# Patient Record
Sex: Male | Born: 1980 | Race: Black or African American | Hispanic: No | Marital: Single | Smoking: Never smoker
Health system: Southern US, Community
[De-identification: ages and names within clinical notes are randomized; demographics above are authoritative.]

## PROBLEM LIST (undated history)

## (undated) DIAGNOSIS — K603 Anal fistula, unspecified: Secondary | ICD-10-CM

## (undated) DIAGNOSIS — L309 Dermatitis, unspecified: Secondary | ICD-10-CM

## (undated) DIAGNOSIS — I10 Essential (primary) hypertension: Secondary | ICD-10-CM

## (undated) DIAGNOSIS — G809 Cerebral palsy, unspecified: Secondary | ICD-10-CM

## (undated) DIAGNOSIS — R2689 Other abnormalities of gait and mobility: Secondary | ICD-10-CM

## (undated) DIAGNOSIS — S62609A Fracture of unspecified phalanx of unspecified finger, initial encounter for closed fracture: Secondary | ICD-10-CM

## (undated) DIAGNOSIS — E785 Hyperlipidemia, unspecified: Secondary | ICD-10-CM

## (undated) HISTORY — PX: FOOT CAPSULE RELEASE W/ PERCUTANEOUS HEEL CORD LENGTHENING, TIBIAL TENDON TRANSFER: SHX1658

## (undated) HISTORY — DX: Anal fistula, unspecified: K60.30

## (undated) HISTORY — DX: Anal fistula: K60.3

---
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
## 2002-06-19 ENCOUNTER — Emergency Department (HOSPITAL_COMMUNITY): Admission: EM | Admit: 2002-06-19 | Discharge: 2002-06-19 | Payer: Self-pay | Admitting: Emergency Medicine

## 2002-07-01 ENCOUNTER — Emergency Department (HOSPITAL_COMMUNITY): Admission: EM | Admit: 2002-07-01 | Discharge: 2002-07-02 | Payer: Self-pay | Admitting: Emergency Medicine

## 2002-07-01 ENCOUNTER — Encounter: Payer: Self-pay | Admitting: Emergency Medicine

## 2003-08-26 ENCOUNTER — Emergency Department (HOSPITAL_COMMUNITY): Admission: EM | Admit: 2003-08-26 | Discharge: 2003-08-26 | Payer: Self-pay | Admitting: *Deleted

## 2003-09-02 ENCOUNTER — Emergency Department (HOSPITAL_COMMUNITY): Admission: EM | Admit: 2003-09-02 | Discharge: 2003-09-02 | Payer: Self-pay | Admitting: Emergency Medicine

## 2003-09-08 ENCOUNTER — Emergency Department (HOSPITAL_COMMUNITY): Admission: EM | Admit: 2003-09-08 | Discharge: 2003-09-08 | Payer: Self-pay | Admitting: Emergency Medicine

## 2004-10-11 ENCOUNTER — Inpatient Hospital Stay (HOSPITAL_COMMUNITY): Admission: EM | Admit: 2004-10-11 | Discharge: 2004-10-13 | Payer: Self-pay | Admitting: Emergency Medicine

## 2004-10-14 ENCOUNTER — Ambulatory Visit (HOSPITAL_COMMUNITY): Admission: RE | Admit: 2004-10-14 | Discharge: 2004-10-14 | Payer: Self-pay | Admitting: Internal Medicine

## 2004-10-25 ENCOUNTER — Ambulatory Visit (HOSPITAL_COMMUNITY): Admission: RE | Admit: 2004-10-25 | Discharge: 2004-10-26 | Payer: Self-pay | Admitting: Family Medicine

## 2004-10-27 ENCOUNTER — Ambulatory Visit: Payer: Self-pay | Admitting: Cardiology

## 2005-05-12 ENCOUNTER — Emergency Department (HOSPITAL_COMMUNITY): Admission: EM | Admit: 2005-05-12 | Discharge: 2005-05-13 | Payer: Self-pay | Admitting: *Deleted

## 2005-05-25 ENCOUNTER — Emergency Department (HOSPITAL_COMMUNITY): Admission: EM | Admit: 2005-05-25 | Discharge: 2005-05-25 | Payer: Self-pay | Admitting: Emergency Medicine

## 2006-01-28 ENCOUNTER — Emergency Department (HOSPITAL_COMMUNITY): Admission: EM | Admit: 2006-01-28 | Discharge: 2006-01-28 | Payer: Self-pay | Admitting: Emergency Medicine

## 2006-02-03 ENCOUNTER — Emergency Department (HOSPITAL_COMMUNITY): Admission: EM | Admit: 2006-02-03 | Discharge: 2006-02-03 | Payer: Self-pay | Admitting: Emergency Medicine

## 2006-06-21 ENCOUNTER — Emergency Department (HOSPITAL_COMMUNITY): Admission: EM | Admit: 2006-06-21 | Discharge: 2006-06-21 | Payer: Self-pay | Admitting: Emergency Medicine

## 2006-09-10 ENCOUNTER — Emergency Department (HOSPITAL_COMMUNITY): Admission: EM | Admit: 2006-09-10 | Discharge: 2006-09-10 | Payer: Self-pay | Admitting: Emergency Medicine

## 2006-09-14 ENCOUNTER — Emergency Department (HOSPITAL_COMMUNITY): Admission: EM | Admit: 2006-09-14 | Discharge: 2006-09-14 | Payer: Self-pay | Admitting: Emergency Medicine

## 2007-07-17 ENCOUNTER — Encounter: Payer: Self-pay | Admitting: Orthopedic Surgery

## 2007-07-17 ENCOUNTER — Emergency Department (HOSPITAL_COMMUNITY): Admission: EM | Admit: 2007-07-17 | Discharge: 2007-07-17 | Payer: Self-pay | Admitting: Emergency Medicine

## 2007-08-01 ENCOUNTER — Ambulatory Visit: Payer: Self-pay | Admitting: Orthopedic Surgery

## 2007-08-01 DIAGNOSIS — S62609A Fracture of unspecified phalanx of unspecified finger, initial encounter for closed fracture: Secondary | ICD-10-CM

## 2007-08-01 HISTORY — DX: Fracture of unspecified phalanx of unspecified finger, initial encounter for closed fracture: S62.609A

## 2008-08-28 ENCOUNTER — Emergency Department (HOSPITAL_COMMUNITY): Admission: EM | Admit: 2008-08-28 | Discharge: 2008-08-28 | Payer: Self-pay | Admitting: Emergency Medicine

## 2011-02-11 NOTE — Procedures (Signed)
NAMETOMAS, SCHAMP            ACCOUNT NO.:  [id number]   MEDICAL RECORD NO.:  [id number]           PATIENT TYPE:   LOCATION:                                FACILITY:  [facility]   PHYSICIAN:  Kofi A. Gerilyn Pilgrim, M.D.      DATE OF BIRTH:   DATE OF PROCEDURE:  DATE OF DISCHARGE:                                EEG INTERPRETATION   Patient is a 30 year old man who has seizures.   ANALYSIS:  A 16-channel recording is conducted for approximately 24 minutes.  There is a well-formed posterior rhythm of 11 Hz which attenuates with eye  opening.  The quality of the recording is excellent.  Stage II sleep is  noted.  K complexes and sleep spindles are observed indicating stage II  sleep.  Photic stimulation does not elicit any abnormal responses in the  background neither do hyperventilation.  There is no focal or lateralized  slowing noted.  No epileptiform discharges are reported.   IMPRESSION:  This is a normal recording of sleep and awake states.  A single  recording does not rule out epilepsy.  If clinically indicated a sleep-  deprived recording could be useful.      KAD/MEDQ  D:  11/04/2004  T:  11/04/2004  Job:  [id number]

## 2011-02-11 NOTE — Discharge Summary (Signed)
NAMESHAHID, FLORI            ACCOUNT NO.:  [id number]   MEDICAL RECORD NO.:  [id number]          PATIENT TYPE:  INP   LOCATION:  A202                          FACILITY:  [facility]   PHYSICIAN:  Calvert Cantor, M.D.     DATE OF BIRTH:  24-Jun-1981   DATE OF ADMISSION:  10/11/2004  DATE OF DISCHARGE:  LH                                 DISCHARGE SUMMARY   PRIMARY CARE PHYSICIANS:  Dr. Katrinka Blazing and Dr. Loleta Chance.   DISCHARGE DIAGNOSES:  1.  Syncope, etiology unknown at this point.  2. Cerebral palsy with some      mental retardation.  3. Obesity.  4. Microcytic anemia.   DISCHARGE INSTRUCTIONS:  Discharge instructions have been discussed with the  patient and his brother.  He is to follow up with Dr. Domingo Sep in one week.  He is to have an EEG tomorrow morning as an outpatient.  He is to improve  his diet and exercise to help lose weight.  His brother states that he is  seeing a dietician who has placed him on a modified diet.  He is to follow  up with Dr. Loleta Chance in one week to find out the results of the EEG and further  workup of his syncope.   HOSPITAL COURSE:  This is a 30 year old obese African-American male who was  admitted for syncopal episode.  He began to have a headache, some dizziness,  and some trouble with his hearing while he was at work.  This worsened as he  was walking home.  When he arrived home, he fell on the floor face down.  His mother was there and noticed that he was unconscious for a couple of  minutes.  She did not notice any seizure-like activity.  No loss of control  of bowel or bladder or any tongue biting.  She stated that when he came to,  he was slightly confused.  He was admitted and a CAT scan was done in the  ER.  The CAT scan showed no acute intracranial abnormalities.  He was placed  on telemetry to watch for any arrhythmias.  His EKG on admission showed  inverted T wave in lead 3 with some flattening of the T wave in AVF.  The  rest of the EKG was normal.   Cardiac enzymes were done, three sets, which  were also normal.  On admission, a urine drug screen was done which did not  show any drug use.  UA was done which was also normal.  Blood work on  admission showed hemoglobin of 14.6, hematocrit of 42.8 with an MCV of 76.5,  and microcytes on the smear.  WBCs were normal at 5.7.  Platelets were 174.  Chemistry was essentially normal.  Sodium was 135, potassium 3.7, chloride  101, bicarbonate 30, glucose 86, BUN 11, creatinine 1.0, calcium 9.1.  An  iron profile was done for the microcytes that were seen in the smear which  showed an iron level of 144 which is actually slightly above normal.  Iron  binding was 280 which is normal.  Percent saturation was 51,  also normal.  Ferritin was 82.   I have ordered a hemoglobin electrophoresis to rule out thalassemia and  sickle cell anemia.  This should be followed up as an outpatient as the  results have not returned.  While on telemetry, the patient had normal sinus  rhythm throughout.  A repeat EKG was done.  This one showed also slight  inversions of T waves in lead 3 with upright P waves in AVF.  The rest of it  was normal showing no Q waves, no ST segment changes.  His QRS was 91.  PR  was 152.  Corrected QT was 406.  A 2D echo was done.  The reading showed  that he had a moderately enlarged right ventricle.  I have discussed this  with Dr. Domingo Sep, and I recommend that he follow up with her as an  outpatient.  The cause for this RV enlargement is not clear. He does not  smoke and therefore is unlikely to have COPD.  I doubt that he has a PE  because he was not tachypneic or tachycardic.  His pulse ox was 100% on room  air on admission.  He has an EEG which is scheduled for tomorrow morning.  I  have explained to his brother that he needs to be taken  for this test and to follow up with Dr. Loleta Chance with the results.  Until now,  the cause of his syncope is unclear.  However, currently he is stable  for  discharge.  He has been walking.  His telemetry strips remain NSR.  He has  not felt any dizziness or had any headaches during his stay.  I suggest  further followup as an outpatient.     Saim   SR/MEDQ  D:  10/13/2004  T:  10/13/2004  Job:  [id number]   cc:   Annia Friendly. Loleta Chance, MD  [address]  [place]  [place] [address]  Fax: [phone]   Dani Gobble, MD  Fax: [phone]

## 2011-02-11 NOTE — H&P (Signed)
NAMEERMINE, SPOFFORD            ACCOUNT NO.:  [id number]   MEDICAL RECORD NO.:  [id number]          PATIENT TYPE:  INP   LOCATION:  ED99                          FACILITY:  [facility]   PHYSICIAN:  Margaretmary Dys, M.D.DATE OF BIRTH:  03/23/1981   DATE OF ADMISSION:  10/11/2004  DATE OF DISCHARGE:  LH                                HISTORY & PHYSICAL   ADMISSION DIAGNOSES:  1.  Syncope.  2.  Cerebral palsy.  3.  Rule out seizure.  4.  Mental retardation.   CHIEF COMPLAINT:  Passed out this afternoon.   HISTORY OF PRESENT ILLNESS:  Mr. Avian, Konigsberg is a 30 year old African-  American male who was brought in by the emergency medical service after he  passed out in his home today. Mother was at home at the time it happened.  She heard a loud thud. When she went to look for him, she found that he was  face down on the floor. Mother said he was not responsive for about 10 to 15  minutes. The patient said prior to him passing out, he felt very lightheaded  but does not remember much. After that, he had some chest pain with weakness  in his left arm. He has never had an episode like this, never had seizure.  The patient did not have any fecal or urinary incontinence, did not have any  seizure activity. Mother did not see if he was jerky. The patient said he  had just had some juice and snack before he passed out.  He denies any headaches preceding this. He has not been working out of the  ordinary. 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 The patient also denies any  cold or flu like symptoms. The patient, however, does have cerebral palsy  and some mental retardation. He also has a residual weakness on his left  side from the cerebral palsy.  He has no fever, chills or rigors. He denies any nausea, vomiting, abdominal  pain, diarrhea, constipation. He has no skin rash. He says he has been doing  fine  until today. He takes no medications on a chronic basis.   REVIEW OF SYSTEMS:  10-point Review of Systems was otherwise negative.   PAST MEDICAL HISTORY:  1.  Mental retardation.  2.  Cerebral palsy.   CURRENT MEDICATIONS:  Does not take any medications.   ALLERGIES:  Does not have any known drug allergies.   SOCIAL HISTORY:  The patient is a Copy, working at [employer]. He lives  with his mother. No history of smoking or alcohol use. Denies any illicit  drug use or marijuana use.   FAMILY HISTORY:  Noncontributory.   PHYSICAL EXAMINATION:  GENERAL:  Conscious, alert, comfortable, not in acute  distress. Oriented to time, place and person.  VITAL SIGNS:  Blood pressure 130/70, pulse 68, respiratory rate 14,  temperature 98.5.  HEENT:  Normocephalic, atraumatic. Oral mucosa was moist with no exudate.  There was no trauma to the head noted. No bruises  or abrasions or hematomas.  NECK:  Supple, no JVD, no lymphadenopathy.  LUNGS:  Clear clinically with good air entry bilaterally.  HEART:  S1, S2, regular, no S3, S4, gallops or rubs.  ABDOMEN:  Soft, non tender, bowel sounds was positive, no masses palpable.  EXTREMITIES:  No pitting pedal edema, no calf induration or tenderness was  noted.  CNS:  He is alert, oriented in time, place and person. Does have evidence of  left arm and left leg weakness with some spasticity.  Otherwise rest of his  neurologic exam was unremarkable. Pupils equal, round and reactive to light  and accommodation bilaterally.   LABORATORY STUDIES/DIAGNOSTIC DATA:  White blood cell count is 5.7,  hemoglobin 14.6, hematocrit 42.8, platelet count is 174. There was no left  shift. Sodium 135, potassium 3.7, chloride 101, CO2 30, glucose 86, BUN 11,  creatinine 1.0, calcium 9.1, CKMB was 6.2, troponin was negative. Myoglobin  was 123, initially it was about 231.  Urine toxicology screen was negative. The urinalysis was also negative. A CT  of the head, that I  just ordered, is pending.   ASSESSMENT AND PLAN:  A 30 year old African-American male with history of  cerebral palsy with left sided weakness and mild mental retardation presents  with syncope, cause of syncope is unclear at this time. Will admit the  patient for observation overnight. His EKG was unremarkable. His blood  glucose is about 86, 85. He was not in the hypoglycemic range. The patient  did not have any motor activity suggestive of seizure.  In view of the patient's history of cerebral palsy, will be concerned about  probable seizure and will institue a seizure precaution. Give Ativan as  needed, will obtain a CT scan of his brain. If all results are negative, I  think the patient will be discharged. However, if he does have recurrence, I  think extensive work up may need to be done. The patient does not have any  evidence of infection such as meningitis or encephalitis at this time. Will  review in the morning. Will monitor for  arrhythmias on telemetry.     Ayor   AM/MEDQ  D:  10/11/2004  T:  10/12/2004  Job:  [id number]

## 2011-02-11 NOTE — Procedures (Signed)
Nicholas Dunlap, Nicholas Dunlap            ACCOUNT NO.:  [id number]   MEDICAL RECORD NO.:  [id number]          PATIENT TYPE:  INP   LOCATION:  A202                          FACILITY:  [facility]   PHYSICIAN:  Dani Gobble, MD       DATE OF BIRTH:  1981-07-13   DATE OF PROCEDURE:  DATE OF DISCHARGE:                                  ECHOCARDIOGRAM   INDICATIONS:  Nicholas Dunlap is a 30 year old gentleman who was admitted with  chest pain and syncope.   TECHNICAL QUALITY OF THE STUDY:  Adequate from the parasternal views, but  suboptimal from the apical and subcostal views.   The aorta is within normal limits at 3.3 cm.   The left atrium is also within normal limits at 3.7 cm.  There were no  obvious clots or masses appreciated and the patient appeared to be in sinus  rhythm during this procedure.   The interventricular septum and posterior wall are within normal limits and  thickness at 1.1 cm and 1.0 cm, respectively.   The aortic valve is probably trileaflet, although the left coronary cusp was  not well visualized.  There was no eccentric closure or aortic insufficiency  to suggest otherwise.  Leaflet excursion was normal.  Doppler interrogation  of the aortic valve was within normal limits.   The mitral valve also appeared structurally normal.  There was no mitral  valve prolapse.  No significant mitral regurgitation was noted.  Doppler  interrogation of the mitral valve was within normal limits.   The pulmonic valve was incompletely visualized, but appeared to be grossly  structurally normal.  No significant pulmonic insufficiency was noted.   The tricuspid valve also appeared grossly structurally normal with trivial  and probably physiologic tricuspid regurgitation noted.   The left ventricle was normal in size with the LVIDD measured at 4.9 cm and  the LVISD measured at 3.3 cm.  Overall left ventricular systolic function  was normal and no regional wall motion abnormalities were  noted.   The right atrium was normal in size.  The right ventricle was moderately  dilated, but with preserved right ventricular systolic function.   IMPRESSION:  1.  Trivial and probably physiologic tricuspid regurgitation.  2.  Normal left ventricular chamber size with normal left ventricular      systolic function and no regional wall motion abnormalities noted.  3.  Moderate right ventricular enlargement with preserved right ventricular      systolic function.      AB/MEDQ  D:  10/13/2004  T:  10/13/2004  Job:  [id number]   cc:   Dani Gobble, MD  Fax: [phone]   Calvert Cantor, M.D.

## 2012-04-11 ENCOUNTER — Emergency Department (HOSPITAL_COMMUNITY)
Admission: EM | Admit: 2012-04-11 | Discharge: 2012-04-11 | Disposition: A | Discharge status: Home / Self Care | Payer: Medicaid Other | Attending: Emergency Medicine | Admitting: Emergency Medicine

## 2012-04-11 ENCOUNTER — Encounter (HOSPITAL_COMMUNITY): Payer: Self-pay | Admitting: *Deleted

## 2012-04-11 ENCOUNTER — Emergency Department (HOSPITAL_COMMUNITY): Payer: Medicaid Other

## 2012-04-11 DIAGNOSIS — Y9239 Other specified sports and athletic area as the place of occurrence of the external cause: Secondary | ICD-10-CM | POA: Insufficient documentation

## 2012-04-11 DIAGNOSIS — S93409A Sprain of unspecified ligament of unspecified ankle, initial encounter: Secondary | ICD-10-CM | POA: Insufficient documentation

## 2012-04-11 DIAGNOSIS — X500XXA Overexertion from strenuous movement or load, initial encounter: Secondary | ICD-10-CM | POA: Insufficient documentation

## 2012-04-11 MED ORDER — IBUPROFEN 800 MG PO TABS: 800.0000 mg | ORAL_TABLET | Freq: Three times a day (TID) | ORAL | Status: AC

## 2012-04-11 NOTE — ED Notes (Signed)
Pt c/o R lateral ankle pain since playing basket ball yesterday; pt able to ambulate; ankle slightly swollen; pt did fall and felt ankle pain at time of fall

## 2012-04-11 NOTE — ED Notes (Signed)
Pt stable at discharge.  

## 2012-04-11 NOTE — ED Provider Notes (Signed)
History     CSN: [id number]  Arrival date & time 04/11/12  1913   First MD Initiated Contact with Patient 04/11/12 2101      Chief Complaint  Patient presents with  . Foot Pain    (Consider location/radiation/quality/duration/timing/severity/associated sxs/prior treatment) Patient is a 31 y.o. male presenting with foot injury. The history is provided by the patient.  Foot Injury  The incident occurred yesterday. The incident occurred at the gym. The injury mechanism was torsion. The pain is present in the right foot and right ankle. The quality of the pain is described as throbbing. The pain is mild. The pain has been constant since onset. Pertinent negatives include no numbness, no inability to bear weight, no muscle weakness, no loss of sensation and no tingling. The symptoms are aggravated by bearing weight and palpation. He has tried nothing for the symptoms. The treatment provided no relief.    History reviewed. No pertinent past medical history.  History reviewed. No pertinent past surgical history.  History reviewed. No pertinent family history.  History  Substance Use Topics  . Smoking status: Never Smoker   . Smokeless tobacco: Not on file  . Alcohol Use: No      Review of Systems  Constitutional: Negative for fever and chills.  Genitourinary: Negative for dysuria and difficulty urinating.  Musculoskeletal: Positive for arthralgias. Negative for joint swelling.  Skin: Negative for color change and wound.  Neurological: Negative for tingling and numbness.  All other systems reviewed and are negative.    Allergies  Review of patient's allergies indicates no known allergies.  Home Medications   Current Outpatient Rx  Name Route Sig Dispense Refill  . AZILSARTAN-CHLORTHALIDONE 40-12.5 MG PO TABS Oral Take 1 tablet by mouth every morning.      There were no vitals taken for this visit.  Physical Exam  Nursing note and vitals reviewed. Constitutional: He  is oriented to person, place, and time. He appears well-developed and well-nourished. No distress.  HENT:  Head: Normocephalic and atraumatic.  Cardiovascular: Normal rate, regular rhythm, normal heart sounds and intact distal pulses.   Pulmonary/Chest: Effort normal and breath sounds normal.  Musculoskeletal: He exhibits tenderness.       Right foot: He exhibits tenderness and swelling. He exhibits normal range of motion, no bony tenderness, normal capillary refill, no crepitus, no deformity and no laceration.       Feet:       Lateral right ankle is ttp, mild STS is present.  ROM is preserved.  DP pulse is brisk, sensation intact.  No erythema, abrasion, bruising or deformity.    Neurological: He is alert and oriented to person, place, and time. He exhibits normal muscle tone. Coordination normal.  Skin: Skin is warm and dry.    ED Course  Procedures (including critical care time)  Labs Reviewed - No data to display Dg Ankle Complete Right  04/11/2012  *RADIOLOGY REPORT*  Clinical Data: Lateral swelling after twisting injury.  RIGHT ANKLE - COMPLETE 3+ VIEW  Comparison: None.  Findings: Diffuse soft tissue swelling about the ankle. No evidence of acute fracture or subluxation.  No focal bone lesions.  Bone matrix and cortex appear intact.  No abnormal radiopaque densities in the soft tissues.  Ankle mortis and talar dome appear intact. Small plantar calcaneal spur.  IMPRESSION: Diffuse soft tissue swelling.  No acute bony abnormalities.  Original Report Authenticated By: Marlon Pel, M.D.     ASO applied, pain improved. Remains NV  intact.  Crutches given   MDM    Tenderness to palpation of the lateral aspect of the right ankle. Mild soft tissue swelling is present. Dorsalis pedis and posterior tibial pulses are brisk, sensation is intact, cap refill is less than 2 seconds. No calf pain, knee pain or pain to the distal foot. Pain is likely related to a sprain. Patient agrees to  followup with orthopedics if the pain is not improving in 1-2 weeks.   The patient appears reasonably screened and/or stabilized for discharge and I doubt any other medical condition or other [facility] requiring further screening, evaluation, or treatment in the ED at this time prior to discharge.   Prescribed ibuprofen for pain   Iantha Titsworth L. [place], [place] 04/17/12 2157

## 2012-04-11 NOTE — ED Notes (Signed)
Right foot pain since playing basketball yesterday, able to ambulate to triage room

## 2012-04-20 NOTE — ED Provider Notes (Signed)
Medical screening examination/treatment/procedure(s) were performed by non-physician practitioner and as supervising physician I was immediately available for consultation/collaboration.  Shakaya Bhullar, MD 04/20/12 0719 

## 2012-08-02 ENCOUNTER — Emergency Department (HOSPITAL_COMMUNITY)
Admission: EM | Admit: 2012-08-02 | Discharge: 2012-08-02 | Disposition: A | Discharge status: Home / Self Care | Payer: Medicaid Other | Attending: Emergency Medicine | Admitting: Emergency Medicine

## 2012-08-02 ENCOUNTER — Encounter (HOSPITAL_COMMUNITY): Payer: Self-pay | Admitting: Emergency Medicine

## 2012-08-02 DIAGNOSIS — Z79899 Other long term (current) drug therapy: Secondary | ICD-10-CM | POA: Insufficient documentation

## 2012-08-02 DIAGNOSIS — I10 Essential (primary) hypertension: Secondary | ICD-10-CM | POA: Insufficient documentation

## 2012-08-02 DIAGNOSIS — IMO0002 Reserved for concepts with insufficient information to code with codable children: Secondary | ICD-10-CM | POA: Insufficient documentation

## 2012-08-02 DIAGNOSIS — B353 Tinea pedis: Secondary | ICD-10-CM | POA: Insufficient documentation

## 2012-08-02 DIAGNOSIS — E785 Hyperlipidemia, unspecified: Secondary | ICD-10-CM | POA: Insufficient documentation

## 2012-08-02 HISTORY — DX: Hyperlipidemia, unspecified: E78.5

## 2012-08-02 HISTORY — DX: Essential (primary) hypertension: I10

## 2012-08-02 LAB — CBC WITH DIFFERENTIAL/PLATELET
Basophils Absolute: 0 10*3/uL (ref 0.0–0.1)
Basophils Relative: 0 % (ref 0–1)
HCT: 43.9 % (ref 39.0–52.0)
Hemoglobin: 14.3 g/dL (ref 13.0–17.0)
MCH: 25.9 pg — ABNORMAL LOW (ref 26.0–34.0)
MCV: 79.4 fL (ref 78.0–100.0)
Monocytes Absolute: 0.1 10*3/uL (ref 0.1–1.0)
Neutro Abs: 1.5 10*3/uL — ABNORMAL LOW (ref 1.7–7.7)
Neutrophils Relative %: 48 % (ref 43–77)
RBC: 5.53 MIL/uL (ref 4.22–5.81)
WBC: 3.2 10*3/uL — ABNORMAL LOW (ref 4.0–10.5)

## 2012-08-02 LAB — COMPREHENSIVE METABOLIC PANEL
AST: 26 U/L (ref 0–37)
Albumin: 3.7 g/dL (ref 3.5–5.2)
Chloride: 104 mEq/L (ref 96–112)
Creatinine, Ser: 1 mg/dL (ref 0.50–1.35)
Potassium: 3.9 mEq/L (ref 3.5–5.1)
Total Bilirubin: 0.8 mg/dL (ref 0.3–1.2)

## 2012-08-02 LAB — GLUCOSE, CAPILLARY

## 2012-08-02 MED ORDER — CEPHALEXIN 500 MG PO CAPS: 500.0000 mg | ORAL_CAPSULE | Freq: Four times a day (QID) | ORAL | Status: DC

## 2012-08-02 MED ORDER — PREDNISONE 50 MG PO TABS: ORAL_TABLET | ORAL | Status: DC

## 2012-08-02 MED ORDER — CLOTRIMAZOLE 1 % EX CREA: TOPICAL_CREAM | CUTANEOUS | Status: DC

## 2012-08-02 NOTE — ED Notes (Signed)
Pt c/o rt foot itching. Pt has seen his pcp twice for the same and being treated for fungal infection.

## 2012-08-02 NOTE — ED Provider Notes (Signed)
History   This chart was scribed for Glynn Octave, MD by Charolett Bumpers . The patient was seen in room APA07/APA07. Patient's care was started at 0822.   CSN: [id number]  Arrival date & time 08/02/12  0704   First MD Initiated Contact with Patient 08/02/12 [phone]      Chief Complaint  Patient presents with  . Foot Pain    The history is provided by the patient. No language interpreter was used.   Nicholas Dunlap is a 31 y.o. male who presents to the Emergency Department complaining of constant, moderate right foot rash with associated itchiness for the past 2 weeks. He states the foot was painful at first, but is now only itchy. He states he has seen his PCP twice for the rash and is being treated with a fungal cream. He reports minimal relief with the cream. He denies any fevers, vomiting. He reports a h/o HTN and HLD but denies any h/o DM.   PCP: Dr. Janna Arch  Past Medical History  Diagnosis Date  . Hypertension   . Hyperlipidemia     History reviewed. No pertinent past surgical history.  History reviewed. No pertinent family history.  History  Substance Use Topics  . Smoking status: Never Smoker   . Smokeless tobacco: Not on file  . Alcohol Use: No      Review of Systems A complete 10 system review of systems was obtained and all systems are negative except as noted in the HPI and PMH.   Allergies  Lactose intolerance (gi)  Home Medications   Current Outpatient Rx  Name  Route  Sig  Dispense  Refill  . AZILSARTAN-CHLORTHALIDONE 40-12.5 MG PO TABS   Oral   Take 1 tablet by mouth every morning.         Marland Kitchen ROSUVASTATIN CALCIUM 20 MG PO TABS   Oral   Take 20 mg by mouth daily.         . CEPHALEXIN 500 MG PO CAPS   Oral   Take 1 capsule (500 mg total) by mouth 4 (four) times daily.   40 capsule   0   . CLOTRIMAZOLE 1 % EX CREA      Apply to affected area 2 times daily   15 g   0   . PREDNISONE 50 MG PO TABS      1 tablet PO daily  5 tablet   0     BP 132/86  Pulse 76  Temp 98.7 F (37.1 C) (Oral)  Resp 20  Ht 5\' 10"  (1.778 m)  Wt 290 lb (131.543 kg)  BMI 41.61 kg/m2  SpO2 97%  Physical Exam  Nursing note and vitals reviewed. Constitutional: He is oriented to person, place, and time. He appears well-developed and well-nourished. No distress.  HENT:  Head: Normocephalic and atraumatic.  Eyes: EOM are normal. Pupils are equal, round, and reactive to light.  Neck: Normal range of motion. Neck supple. No tracheal deviation present.  Cardiovascular: Normal rate.   Pulmonary/Chest: Effort normal. No respiratory distress.  Musculoskeletal: Normal range of motion. He exhibits no edema.  Neurological: He is alert and oriented to person, place, and time.  Skin: Skin is warm and dry.       Scaly rash to right forefoot on dorsal surface of all toes. Small area of excoriation on forefoot and right great toe.+2 DP and PT pulses bilaterally. On left foot, scaly rash to 2nd, 3rd and 4th toes on left.  Psychiatric: He has a normal mood and affect. His behavior is normal.    ED Course  Procedures (including critical care time)  DIAGNOSTIC STUDIES: Oxygen Saturation is 97% on room air, adequate by my interpretation.    COORDINATION OF CARE:  08:29-Discussed planned course of treatment with the patient including a CMP, CBC and blood glucose, who is agreeable at this time.   Labs Reviewed  CBC WITH DIFFERENTIAL - Abnormal; Notable for the following:    WBC 3.2 (*)     MCH 25.9 (*)     Neutro Abs 1.5 (*)     Eosinophils Relative 10 (*)     All other components within normal limits  GLUCOSE, CAPILLARY  COMPREHENSIVE METABOLIC PANEL   No results found.   1. Athlete's foot       MDM  Scaly itchy rash to bilateral feet x 2 week.  Used ketoconazole without relief. Pulses intact, no overlying cellulitis, small area of excoriation without open wounds.  Exam consistent with tinea pedis. Given antifungal,  steroids, prophylactic antibiotic.   I personally performed the services described in this documentation, which was scribed in my presence.  The recorded information has been reviewed and considered.      Glynn Octave, MD 08/02/12 1622

## 2012-08-02 NOTE — ED Notes (Signed)
Pt awaiting md, voices no complaints.

## 2015-11-17 ENCOUNTER — Encounter (HOSPITAL_COMMUNITY): Payer: Self-pay

## 2015-11-17 ENCOUNTER — Emergency Department (HOSPITAL_COMMUNITY): Payer: Medicaid Other

## 2015-11-17 ENCOUNTER — Emergency Department (HOSPITAL_COMMUNITY)
Admission: EM | Admit: 2015-11-17 | Discharge: 2015-11-17 | Disposition: A | Discharge status: Home / Self Care | Payer: Medicaid Other | Attending: Emergency Medicine | Admitting: Emergency Medicine

## 2015-11-17 DIAGNOSIS — R1084 Generalized abdominal pain: Secondary | ICD-10-CM | POA: Insufficient documentation

## 2015-11-17 DIAGNOSIS — M545 Low back pain: Secondary | ICD-10-CM | POA: Insufficient documentation

## 2015-11-17 DIAGNOSIS — Z79899 Other long term (current) drug therapy: Secondary | ICD-10-CM | POA: Insufficient documentation

## 2015-11-17 DIAGNOSIS — E785 Hyperlipidemia, unspecified: Secondary | ICD-10-CM | POA: Diagnosis not present

## 2015-11-17 DIAGNOSIS — R509 Fever, unspecified: Secondary | ICD-10-CM | POA: Insufficient documentation

## 2015-11-17 DIAGNOSIS — I10 Essential (primary) hypertension: Secondary | ICD-10-CM | POA: Insufficient documentation

## 2015-11-17 LAB — COMPREHENSIVE METABOLIC PANEL WITH GFR
ALT: 19 U/L (ref 17–63)
AST: 19 U/L (ref 15–41)
Albumin: 3.9 g/dL (ref 3.5–5.0)
Alkaline Phosphatase: 53 U/L (ref 38–126)
Anion gap: 6 (ref 5–15)
BUN: 14 mg/dL (ref 6–20)
CO2: 30 mmol/L (ref 22–32)
Calcium: 8.9 mg/dL (ref 8.9–10.3)
Chloride: 102 mmol/L (ref 101–111)
Creatinine, Ser: 1.09 mg/dL (ref 0.61–1.24)
GFR calc Af Amer: >60 mL/min (ref >60)
GFR calc non Af Amer: >60 mL/min (ref >60)
Glucose, Bld: 100 mg/dL — ABNORMAL HIGH (ref 65–99)
Potassium: 4.4 mmol/L (ref 3.5–5.1)
Sodium: 138 mmol/L (ref 135–145)
Total Bilirubin: 1.1 mg/dL (ref 0.3–1.2)
Total Protein: 7.3 g/dL (ref 6.5–8.1)

## 2015-11-17 LAB — CBC WITH DIFFERENTIAL/PLATELET
Basophils Absolute: 0 K/uL (ref 0.0–0.1)
Basophils Relative: 0 %
Eosinophils Absolute: 0 K/uL (ref 0.0–0.7)
Eosinophils Relative: 0 %
HCT: 42.8 % (ref 39.0–52.0)
Hemoglobin: 13.9 g/dL (ref 13.0–17.0)
Lymphocytes Relative: 13 %
Lymphs Abs: 0.8 K/uL (ref 0.7–4.0)
MCH: 26.2 pg (ref 26.0–34.0)
MCHC: 32.5 g/dL (ref 30.0–36.0)
MCV: 80.6 fL (ref 78.0–100.0)
Monocytes Absolute: 0.4 K/uL (ref 0.1–1.0)
Monocytes Relative: 7 %
Neutro Abs: 4.8 K/uL (ref 1.7–7.7)
Neutrophils Relative %: 80 %
Platelets: 152 K/uL (ref 150–400)
RBC: 5.31 MIL/uL (ref 4.22–5.81)
RDW: 13.5 % (ref 11.5–15.5)
WBC: 6 K/uL (ref 4.0–10.5)

## 2015-11-17 LAB — URINALYSIS, ROUTINE W REFLEX MICROSCOPIC
Bilirubin Urine: NEGATIVE
GLUCOSE, UA: NEGATIVE mg/dL
KETONES UR: NEGATIVE mg/dL
LEUKOCYTES UA: NEGATIVE
Nitrite: NEGATIVE
PH: 6.5 (ref 5.0–8.0)
Protein, ur: NEGATIVE mg/dL

## 2015-11-17 LAB — URINE MICROSCOPIC-ADD ON
Squamous Epithelial / HPF: NONE SEEN
WBC, UA: NONE SEEN WBC/hpf (ref 0–5)

## 2015-11-17 LAB — LIPASE, BLOOD: LIPASE: 21 U/L (ref 11–51)

## 2015-11-17 MED ORDER — DIATRIZOATE MEGLUMINE & SODIUM 66-10 % PO SOLN
ORAL | Status: AC
  Filled 2015-11-17: qty 30

## 2015-11-17 MED ORDER — SODIUM CHLORIDE 0.9 % IV SOLN
INTRAVENOUS | Status: DC
  Administered 2015-11-17: 08:00:00 via INTRAVENOUS

## 2015-11-17 MED ORDER — ONDANSETRON HCL 4 MG/2ML IJ SOLN
4.0000 mg | Freq: Once | INTRAMUSCULAR | Status: AC
  Administered 2015-11-17: 4 mg via INTRAVENOUS
  Filled 2015-11-17: qty 2

## 2015-11-17 MED ORDER — IOHEXOL 300 MG/ML  SOLN
100.0000 mL | Freq: Once | INTRAMUSCULAR | Status: AC | PRN
  Administered 2015-11-17: 100 mL via INTRAVENOUS

## 2015-11-17 MED ORDER — TRAMADOL HCL 50 MG PO TABS: 50.0000 mg | ORAL_TABLET | Freq: Four times a day (QID) | ORAL | Status: DC | PRN

## 2015-11-17 MED ORDER — SODIUM CHLORIDE 0.9 % IV BOLUS (SEPSIS)
500.0000 mL | Freq: Once | INTRAVENOUS | Status: AC
  Administered 2015-11-17: 500 mL via INTRAVENOUS

## 2015-11-17 NOTE — ED Notes (Signed)
Pt reminded that a urine specimen is needed. Pt verbalized understanding and will notify staff when one can be obtained.

## 2015-11-17 NOTE — ED Notes (Signed)
Pt reports lower abd pain since Sunday.  Denies n/v/d.  Denies urinary symptoms.  Reports lbm was this morning and was normal but says it hurt his stomach to have a bm. Reports has recently started exercising.

## 2015-11-17 NOTE — ED Notes (Signed)
Dr. Zackowski at bedside  

## 2015-11-17 NOTE — ED Provider Notes (Signed)
CSN: [id number]     Arrival date & time 11/17/15  0726 History  By signing my name below, I, Ronney Lion, attest that this documentation has been prepared under the direction and in the presence of Vanetta Mulders, MD. Electronically Signed: Ronney Lion, ED Scribe. 11/17/2015. 10:25 AM.   Chief Complaint  Patient presents with  . Abdominal Pain   Patient is a 35 y.o. male presenting with abdominal pain. The history is provided by the patient. No language interpreter was used.  Abdominal Pain Pain location:  Generalized Pain radiates to:  Does not radiate Pain severity:  Moderate Duration:  3 days Progression:  Unchanged Chronicity:  New Relieved by:  None tried Worsened by:  Nothing tried Ineffective treatments:  None tried Associated symptoms: fever (subjective)   Associated symptoms: no chest pain, no cough, no diarrhea, no dysuria, no nausea, no sore throat and no vomiting    HPI Comments: Nicholas Dunlap is a 35 y.o. male who presents to the Emergency Department complaining of 7/10generalized abdominal pain that began 3 days ago. Patient states he had fractured his sacrum several years ago, and he began to experience sacral pain again 2 days ago; he denies any recent falls or injuries He also notes a subjective fever yesterday. Per nursing notes, he states his LBM was this morning and was normal, but his abdominal pain is worsened whenever he has a BM. Patient states he recently started exercising, per nursing notes. He denies nausea, vomiting, diarrhea, visual changes, cough, rhinorrhea, sore throat, chest pain, SOB, dysuria, leg swelling, bleeding easily, rash, or headache.    Past Medical History  Diagnosis Date  . Hypertension   . Hyperlipidemia    History reviewed. No pertinent past surgical history. No family history on file. Social History  Substance Use Topics  . Smoking status: Never Smoker   . Smokeless tobacco: None  . Alcohol Use: No    Review of Systems   Constitutional: Positive for fever (subjective).  HENT: Negative for rhinorrhea and sore throat.   Eyes: Negative for visual disturbance.  Respiratory: Negative for cough.   Cardiovascular: Negative for chest pain and leg swelling.  Gastrointestinal: Positive for abdominal pain. Negative for nausea, vomiting and diarrhea.  Genitourinary: Negative for dysuria.  Musculoskeletal: Positive for back pain (sacral pain).  Skin: Negative for rash.  Neurological: Negative for headaches.  Hematological: Does not bruise/bleed easily.   Allergies  Lactose intolerance (gi)  Home Medications   Prior to Admission medications   Medication Sig Start Date End Date Taking? Authorizing Provider  Azilsartan-Chlorthalidone (EDARBYCLOR) 40-12.5 MG TABS Take 1 tablet by mouth every morning.   Yes Historical Provider, MD  rosuvastatin (CRESTOR) 20 MG tablet Take 20 mg by mouth daily.   Yes Historical Provider, MD  traMADol (ULTRAM) 50 MG tablet Take 1 tablet (50 mg total) by mouth every 6 (six) hours as needed. 11/17/15   Vanetta Mulders, MD   BP 109/67 mmHg  Pulse 60  Temp(Src) 97.9 F (36.6 C) (Oral)  Resp 16  Ht  (1.778 m)  Wt 117.935 kg  BMI 37.31 kg/m2  SpO2 99% Physical Exam  Constitutional: He is oriented to person, place, and time. He appears well-developed and well-nourished. No distress.  HENT:  Head: Normocephalic and atraumatic.  Mouth/Throat: Oropharynx is clear and moist.  Mucous membranes are moist.  Eyes: Conjunctivae and EOM are normal. Pupils are equal, round, and reactive to light. No scleral icterus.  Pupils are normal. Sclera is clear. Eyes  track normal.  Neck: Neck supple. No tracheal deviation present.  Cardiovascular: Normal rate, regular rhythm and normal heart sounds.  Exam reveals no gallop and no friction rub.   No murmur heard. Pulmonary/Chest: Effort normal and breath sounds normal. No respiratory distress. He has no wheezes. He has no rales.  Lungs are clear  to auscultation.  Abdominal: Soft. Bowel sounds are normal. There is no tenderness.  Abdomen nontender.  Musculoskeletal: Normal range of motion. He exhibits no edema.  No ankle swelling.  Tailbone area and perianal area appear normal.  Neurological: He is alert and oriented to person, place, and time.  Skin: Skin is warm and dry.  Psychiatric: He has a normal mood and affect. His behavior is normal.  Nursing note and vitals reviewed.   ED Course  Procedures (including critical care time)  DIAGNOSTIC STUDIES: Oxygen Saturation is 100% on RA, normal by my interpretation.    COORDINATION OF CARE: 8:15 AM - Discussed treatment plan with pt at bedside which includes diagnostic testing. Pt verbalized understanding and agreed to plan.   Labs Review Labs Reviewed  URINALYSIS, ROUTINE W REFLEX MICROSCOPIC (NOT AT [facility]) - Abnormal; Notable for the following:    Specific Gravity, Urine <1.005 (*)    Hgb urine dipstick TRACE (*)    All other components within normal limits  COMPREHENSIVE METABOLIC PANEL - Abnormal; Notable for the following:    Glucose, Bld 100 (*)    All other components within normal limits  URINE MICROSCOPIC-ADD ON - Abnormal; Notable for the following:    Bacteria, UA RARE (*)    All other components within normal limits  LIPASE, BLOOD  CBC WITH DIFFERENTIAL/PLATELET   Results for orders placed or performed during the hospital encounter of 11/17/15  Urinalysis, Routine w reflex microscopic (not at [facility])  Result Value Ref Range   Color, Urine YELLOW YELLOW   APPearance CLEAR CLEAR   Specific Gravity, Urine <1.005 (L) 1.005 - 1.030   pH 6.5 5.0 - 8.0   Glucose, UA NEGATIVE NEGATIVE mg/dL   Hgb urine dipstick TRACE (A) NEGATIVE   Bilirubin Urine NEGATIVE NEGATIVE   Ketones, ur NEGATIVE NEGATIVE mg/dL   Protein, ur NEGATIVE NEGATIVE mg/dL   Nitrite NEGATIVE NEGATIVE   Leukocytes, UA NEGATIVE NEGATIVE  Comprehensive metabolic panel  Result Value Ref Range    Sodium 138 135 - 145 mmol/L   Potassium 4.4 3.5 - 5.1 mmol/L   Chloride 102 101 - 111 mmol/L   CO2 30 22 - 32 mmol/L   Glucose, Bld 100 (H) 65 - 99 mg/dL   BUN 14 6 - 20 mg/dL   Creatinine, Ser 1.61 0.61 - 1.24 mg/dL   Calcium 8.9 8.9 - 09.6 mg/dL   Total Protein 7.3 6.5 - 8.1 g/dL   Albumin 3.9 3.5 - 5.0 g/dL   AST 19 15 - 41 U/L   ALT 19 17 - 63 U/L   Alkaline Phosphatase 53 38 - 126 U/L   Total Bilirubin 1.1 0.3 - 1.2 mg/dL   GFR calc non Af Amer >60 >60 mL/min   GFR calc Af Amer >60 >60 mL/min   Anion gap 6 5 - 15  Lipase, blood  Result Value Ref Range   Lipase 21 11 - 51 U/L  CBC with Differential/Platelet  Result Value Ref Range   WBC 6.0 4.0 - 10.5 K/uL   RBC 5.31 4.22 - 5.81 MIL/uL   Hemoglobin 13.9 13.0 - 17.0 g/dL   HCT 04.5 40.9 -  52.0 %   MCV 80.6 78.0 - 100.0 fL   MCH 26.2 26.0 - 34.0 pg   MCHC 32.5 30.0 - 36.0 g/dL   RDW 16.1 09.6 - 04.5 %   Platelets 152 150 - 400 K/uL   Neutrophils Relative % 80 %   Neutro Abs 4.8 1.7 - 7.7 K/uL   Lymphocytes Relative 13 %   Lymphs Abs 0.8 0.7 - 4.0 K/uL   Monocytes Relative 7 %   Monocytes Absolute 0.4 0.1 - 1.0 K/uL   Eosinophils Relative 0 %   Eosinophils Absolute 0.0 0.0 - 0.7 K/uL   Basophils Relative 0 %   Basophils Absolute 0.0 0.0 - 0.1 K/uL  Urine microscopic-add on  Result Value Ref Range   Squamous Epithelial / LPF NONE SEEN NONE SEEN   WBC, UA NONE SEEN 0 - 5 WBC/hpf   RBC / HPF 0-5 0 - 5 RBC/hpf   Bacteria, UA RARE (A) NONE SEEN     Imaging Review Ct Abdomen Pelvis W Contrast  11/17/2015  CLINICAL DATA:  Lower abdominal pain since 11/15/2015 EXAM: CT ABDOMEN AND PELVIS WITH CONTRAST TECHNIQUE: Multidetector CT imaging of the abdomen and pelvis was performed using the standard protocol following bolus administration of intravenous contrast. CONTRAST:  OMNIPAQUE IOHEXOL 300 MG/ML  SOLN COMPARISON:  None. FINDINGS: Lung bases are clear.  No effusions.  Heart is normal size. Liver, gallbladder,  spleen, pancreas, adrenals and kidneys are normal. Normal appendix. Bowel grossly unremarkable. No free fluid, free air, or adenopathy. Aorta is normal caliber. Urinary bladder grossly unremarkable. No acute bony abnormality or focal bone lesion. IMPRESSION: Unremarkable study.  No acute findings in the abdomen or pelvis. Electronically Signed   By: Charlett Nose M.D.   On: 11/17/2015 08:58   I have personally reviewed and evaluated these images and lab results as part of my medical decision-making.   EKG Interpretation None      MDM   Final diagnoses:  Generalized abdominal pain   Workup for the abdominal pain without any acute findings. Labs without any significant abnormalities. Will treat symptomatically. CT scan negative.  I personally performed the services described in this documentation, which was scribed in my presence. The recorded information has been reviewed and is accurate.       Vanetta Mulders, MD 11/17/15 1026

## 2015-11-17 NOTE — Discharge Instructions (Signed)
Workup for the abdominal pain without any acute findings. Make appointment to follow-up with your regular doctor. Take the tramadol as needed for pain. Return for any new or worse symptoms.

## 2016-05-18 ENCOUNTER — Encounter (HOSPITAL_COMMUNITY): Payer: Self-pay | Admitting: Emergency Medicine

## 2016-05-18 ENCOUNTER — Emergency Department (HOSPITAL_COMMUNITY)
Admission: EM | Admit: 2016-05-18 | Discharge: 2016-05-18 | Disposition: A | Discharge status: Home / Self Care | Payer: Medicaid Other | Attending: Emergency Medicine | Admitting: Emergency Medicine

## 2016-05-18 ENCOUNTER — Emergency Department (HOSPITAL_COMMUNITY): Payer: Medicaid Other

## 2016-05-18 DIAGNOSIS — K6289 Other specified diseases of anus and rectum: Secondary | ICD-10-CM | POA: Diagnosis present

## 2016-05-18 DIAGNOSIS — Z79899 Other long term (current) drug therapy: Secondary | ICD-10-CM | POA: Diagnosis not present

## 2016-05-18 DIAGNOSIS — L0390 Cellulitis, unspecified: Secondary | ICD-10-CM

## 2016-05-18 DIAGNOSIS — L0291 Cutaneous abscess, unspecified: Secondary | ICD-10-CM

## 2016-05-18 DIAGNOSIS — K611 Rectal abscess: Secondary | ICD-10-CM | POA: Insufficient documentation

## 2016-05-18 DIAGNOSIS — I10 Essential (primary) hypertension: Secondary | ICD-10-CM | POA: Diagnosis not present

## 2016-05-18 LAB — CBC WITH DIFFERENTIAL/PLATELET
Basophils Absolute: 0 10*3/uL (ref 0.0–0.1)
Basophils Relative: 0 %
EOS ABS: 0 10*3/uL (ref 0.0–0.7)
EOS PCT: 0 %
HCT: 41.9 % (ref 39.0–52.0)
Hemoglobin: 13.8 g/dL (ref 13.0–17.0)
LYMPHS ABS: 0.8 10*3/uL (ref 0.7–4.0)
Lymphocytes Relative: 10 %
MCH: 26.8 pg (ref 26.0–34.0)
MCHC: 32.9 g/dL (ref 30.0–36.0)
MCV: 81.4 fL (ref 78.0–100.0)
MONO ABS: 0.7 10*3/uL (ref 0.1–1.0)
MONOS PCT: 9 %
Neutro Abs: 6.9 10*3/uL (ref 1.7–7.7)
Neutrophils Relative %: 81 %
PLATELETS: 166 10*3/uL (ref 150–400)
RBC: 5.15 MIL/uL (ref 4.22–5.81)
RDW: 13.8 % (ref 11.5–15.5)
WBC: 8.5 10*3/uL (ref 4.0–10.5)

## 2016-05-18 LAB — COMPREHENSIVE METABOLIC PANEL
ALK PHOS: 46 U/L (ref 38–126)
ALT: 20 U/L (ref 17–63)
ANION GAP: 5 (ref 5–15)
AST: 20 U/L (ref 15–41)
Albumin: 4 g/dL (ref 3.5–5.0)
BUN: 14 mg/dL (ref 6–20)
CALCIUM: 9.3 mg/dL (ref 8.9–10.3)
CO2: 30 mmol/L (ref 22–32)
Chloride: 98 mmol/L — ABNORMAL LOW (ref 101–111)
Creatinine, Ser: 1.12 mg/dL (ref 0.61–1.24)
GFR calc non Af Amer: >60 mL/min (ref >60)
Glucose, Bld: 101 mg/dL — ABNORMAL HIGH (ref 65–99)
Potassium: 4.4 mmol/L (ref 3.5–5.1)
SODIUM: 133 mmol/L — AB (ref 135–145)
Total Bilirubin: 1.7 mg/dL — ABNORMAL HIGH (ref 0.3–1.2)
Total Protein: 7.5 g/dL (ref 6.5–8.1)

## 2016-05-18 LAB — URINALYSIS, ROUTINE W REFLEX MICROSCOPIC
Bilirubin Urine: NEGATIVE
Glucose, UA: NEGATIVE mg/dL
Hgb urine dipstick: NEGATIVE
KETONES UR: NEGATIVE mg/dL
LEUKOCYTES UA: NEGATIVE
NITRITE: NEGATIVE
PH: 7 (ref 5.0–8.0)
PROTEIN: NEGATIVE mg/dL
Specific Gravity, Urine: 1.015 (ref 1.005–1.030)

## 2016-05-18 MED ORDER — DIATRIZOATE MEGLUMINE & SODIUM 66-10 % PO SOLN
ORAL | Status: AC
  Filled 2016-05-18: qty 30

## 2016-05-18 MED ORDER — HYDROCODONE-ACETAMINOPHEN 5-325 MG PO TABS
1.0000 | ORAL_TABLET | Freq: Once | ORAL | Status: AC
  Administered 2016-05-18: 1 via ORAL
  Filled 2016-05-18: qty 1

## 2016-05-18 MED ORDER — AMOXICILLIN-POT CLAVULANATE 875-125 MG PO TABS: 1.0000 | ORAL_TABLET | Freq: Two times a day (BID) | ORAL | 0 refills | Status: DC

## 2016-05-18 MED ORDER — AMOXICILLIN-POT CLAVULANATE 875-125 MG PO TABS
1.0000 | ORAL_TABLET | Freq: Once | ORAL | Status: AC
  Administered 2016-05-18: 1 via ORAL
  Filled 2016-05-18: qty 1

## 2016-05-18 MED ORDER — IOPAMIDOL (ISOVUE-300) INJECTION 61%
100.0000 mL | Freq: Once | INTRAVENOUS | Status: AC | PRN
  Administered 2016-05-18: 100 mL via INTRAVENOUS

## 2016-05-18 MED ORDER — OXYCODONE-ACETAMINOPHEN 5-325 MG PO TABS: 1.0000 | ORAL_TABLET | Freq: Four times a day (QID) | ORAL | 0 refills | Status: DC | PRN

## 2016-05-18 MED ORDER — OXYCODONE-ACETAMINOPHEN 5-325 MG PO TABS
1.0000 | ORAL_TABLET | Freq: Once | ORAL | Status: AC
  Administered 2016-05-18: 1 via ORAL
  Filled 2016-05-18: qty 1

## 2016-05-18 NOTE — ED Triage Notes (Signed)
Pt c/o rectal pain with intermittent bright red blood in stools. Denies any hx of hemorrhoids. Denies abdominal pain or n/v/d.

## 2016-05-18 NOTE — Consult Note (Signed)
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 Patient currently reports +flatus and +BM, denies fever/chills. He denies any prior similar episodes or trauma to the peri-anal or sacral areas, but review of his chart indicates he presented this past February for 7/10 abdominal-sacral pain during bowel movements and at that time reported he'd fractured his sacrum several years ago. CT at that time was interpreted as no significant abnormalities, and the patient was discharged home with treatment for his symptoms without antibiotics or abscess drainage.   Surgery is consulted by Dr. Clayborne DanaMesner in this context for evaluation and management of peri-anal abscess.  PAST MEDICAL HISTORY (PMH):  Past Medical History:  Diagnosis Date  . Hyperlipidemia   . Hypertension      PAST SURGICAL HISTORY (PSH):  History reviewed. No pertinent surgical history.   MEDICATIONS:  Prior to Admission medications   Medication Sig Start Date End Date Taking? Authorizing Provider  Azilsartan-Chlorthalidone (EDARBYCLOR) 40-12.5 MG TABS Take 1 tablet by mouth every morning.   Yes Historical Provider, MD  rosuvastatin (CRESTOR) 20 MG tablet Take 20 mg by mouth daily.   Yes Historical Provider, MD  traMADol (ULTRAM) 50 MG tablet Take 1 tablet (50 mg total) by mouth every 6 (six) hours as needed. Patient not taking: Reported on 05/18/2016 11/17/15   Vanetta MuldersScott Zackowski, MD     ALLERGIES:  Allergies  Allergen Reactions  . Lactose Intolerance (Gi) Other (See Comments)    Upsets stomach     SOCIAL HISTORY:  Social History   Social History  . Marital status: Single    Spouse name: N/A  . Number of children: N/A  . Years of education: N/A    Occupational History  . Not on file.   Social History Main Topics  . Smoking status: Never Smoker  . Smokeless tobacco: Never Used  . Alcohol use No  . Drug use: No  . Sexual activity: Not on file   Other Topics Concern  . Not on file   Social History Narrative  . No narrative on file    The patient currently resides (home / rehab facility / nursing home): Home  The patient normally is (ambulatory / bedbound): Ambulatory   FAMILY HISTORY:  No family history on file.    REVIEW OF SYSTEMS:  Constitutional: denies weight loss, fever, chills, or sweats  Eyes: denies any other vision changes, history of eye injury  ENT: denies sore throat, hearing problems  Respiratory: denies shortness of breath, wheezing  Cardiovascular: denies chest pain, palpitations  Gastrointestinal: denies abdominal pain, N/V, or diarrhea  Anorectal: peri-anorectal pain with defecation as per HPI Musculoskeletal: denies any other joint pains or cramps  Skin: denies any other rashes or skin discolorations  Neurological: denies any other headache, dizziness, weakness  Psychiatric: denies any other depression, anxiety   All other review of systems were negative   VITAL SIGNS:  Temp:  [98.4 F (36.9 C)-98.7 F (37.1 C)] 98.7 F (37.1 C) (08/23 1157) Pulse Rate:  [64-86] 64 (08/23 1157) Resp:  [17-18] 17 (08/23 1157) BP: (101-109)/(51-69) 101/69 (08/23 1157) SpO2:  [99 %-100 %] 99 % (08/23 1157) Weight:  [101.2 kg (223 lb)] 101.2 kg (223 lb) (08/23 0818)  Height: 5\' 10"  (177.8 cm) Weight: 101.2 kg (223 lb) BMI (Calculated): 32.1   INTAKE/OUTPUT:  This shift: No intake/output data recorded.  Last 2 shifts: @IOLAST2SHIFTS @   PHYSICAL EXAM:  Constitutional:  -- Normal body habitus  -- Awake, alert, and oriented x3  Eyes:  -- Pupils equally round and reactive to light  -- No scleral icterus  Ear, nose, and throat:  -- No jugular venous distension  Pulmonary:  -- No crackles  -- Equal  breath sounds bilaterally  Cardiovascular:  -- S1, S2 present  -- No pericardial rubs Gastrointestinal:  -- Abdomen soft, nontender, nondistended, no guarding/rebound  -- No abdominal masses appreciated, pulsatile or otherwise  Anorectal: -- Circumferential non-focal peri-anal tenderness to palpation without appreciable fluctuance and no erythema -- Appropriate tone without blood per rectum Musculoskeletal / Integumentary:  -- Wounds or skin discoloration: None appreciated -- Extremities: B/L UE and LE FROM, hands and feet warm, no edema  Neurologic:  -- Motor function: intact and symmetric -- Sensation: intact and symmetric   Labs:  CBC:  Lab Results  Component Value Date   WBC 8.5 05/18/2016   RBC 5.15 05/18/2016   BMP:  Lab Results  Component Value Date   GLUCOSE 101 (H) 05/18/2016   CO2 30 05/18/2016   BUN 14 05/18/2016   CREATININE 1.12 05/18/2016   CALCIUM 9.3 05/18/2016     Imaging studies:  CT Abdomen and Pelvis with Contrast (05/18/2016) - personally reviewed and reviewed with on-site radiologist: peri-anal abscess indiscernible from anal canal at the involved level and slightly larger than present on CT previously interpreted as no abscess in 10/2015   Assessment/Plan:  35 y.o. male with slight increase in size of chronic peri-anal fluid collection, possible abscess, indistinguishable from anal canal on imaging and without fever/chills or leukocytosis, previously missed on 10/2015 CT, complicated by pertinent comorbidities including cerebral palsy, HTN, and hyperlipidemia.   - discussed with Dr. Clayborne DanaMesner treatment with antibiotics and close follow-up in 1 week   - radiology also recommends follow-up CT imaging to reassess fluid collection/abscess in 1 month   - findings and recommendations were discussed with patient, who expresses understanding  All of the above findings and recommendations were discussed with the patient, and all of his questions were answered to  his expressed satisfaction.  Thank you for the opportunity to participate in this patient's care.   -- Scherrie GerlachJason E. Earlene Plateravis, MD, RPVI [place]: [facility] Office: [phone]

## 2016-05-18 NOTE — ED Notes (Signed)
Pt taken to CT.

## 2016-05-18 NOTE — ED Provider Notes (Signed)
[facility]-EMERGENCY DEPT Provider Note   CSN: [id number] Arrival date & time: 05/18/16  [id number]  By signing my name below, I, Freida Busmaniana Omoyeni, attest that this documentation has been prepared under the direction and in the presence of Marily MemosJason Nyella Eckels, MD . Electronically Signed: Freida Busmaniana Omoyeni, Scribe. 05/18/2016. 9:29 AM.    History   Chief Complaint Chief Complaint  Patient presents with  . Rectal Pain    The history is provided by the patient. No language interpreter was used.   HPI Comments:  Nicholas Dunlap is a 35 y.o. male who presents to the Emergency Department complaining of 8/10 rectal pain x 2 days. He denies h/o same. He also denies drainage from the site. He states he's had a few  BMs since symptom onset which were somewhat painful. He denies fever, nausea, vomiting, and diarrhea. No alleviating factors noted. Pt is not sexually active.    Past Medical History:  Diagnosis Date  . Hyperlipidemia   . Hypertension     Patient Active Problem List   Diagnosis Date Noted  . CLOSED FRACTURE UNSPEC PHALANX/PHALANGES HAND 08/01/2007    History reviewed. No pertinent surgical history.     Home Medications    Prior to Admission medications   Medication Sig Start Date End Date Taking? Authorizing Provider  Azilsartan-Chlorthalidone (EDARBYCLOR) 40-12.5 MG TABS Take 1 tablet by mouth every morning.   Yes Historical Provider, MD  rosuvastatin (CRESTOR) 20 MG tablet Take 20 mg by mouth daily.   Yes Historical Provider, MD  amoxicillin-clavulanate (AUGMENTIN) 875-125 MG tablet Take 1 tablet by mouth 2 (two) times daily. One po bid x 10 days 05/18/16   Marily MemosJason Isiac Breighner, MD  oxyCODONE-acetaminophen (PERCOCET/ROXICET) 5-325 MG tablet Take 1-2 tablets by mouth every 6 (six) hours as needed for severe pain. 05/18/16   Marily MemosJason Naviah Belfield, MD    Family History No family history on file.  Social History Social History  Substance Use Topics  . Smoking status: Never Smoker  . Smokeless tobacco:  Never Used  . Alcohol use No     Allergies   Lactose intolerance (gi)   Review of Systems Review of Systems  Constitutional: Negative for fever.  Gastrointestinal: Positive for rectal pain. Negative for diarrhea, nausea and vomiting.  All other systems reviewed and are negative.    Physical Exam Updated Vital Signs BP (!) 100/49 (BP Location: Left Arm)   Pulse 97   Temp 98.7 F (37.1 C) (Oral)   Resp 16   Ht 5\' 10"  (1.778 m)   Wt 223 lb (101.2 kg)   SpO2 97%   BMI 32.00 kg/m   Physical Exam  Constitutional: He is oriented to person, place, and time. He appears well-developed and well-nourished. No distress.  HENT:  Head: Normocephalic and atraumatic.  Eyes: Conjunctivae are normal.  Cardiovascular: Normal rate.   Pulmonary/Chest: Effort normal.  Abdominal: He exhibits no distension.  Genitourinary:  Genitourinary Comments: Tenderness to left of the rectum; no abscess drainage, blood, fissure, or induration noted   Chaperone was present for exam which was performed with no discomfort or complications.   Neurological: He is alert and oriented to person, place, and time.  Skin: Skin is warm and dry.  Psychiatric: He has a normal mood and affect.  Nursing note and vitals reviewed.    ED Treatments / Results  DIAGNOSTIC STUDIES:  Oxygen Saturation is 100% on RA, normal by my interpretation.    COORDINATION OF CARE:  8:55 AM Discussed treatment plan with pt at  bedside and pt agreed to plan.  Labs (all labs ordered are listed, but only abnormal results are displayed) Labs Reviewed  COMPREHENSIVE METABOLIC PANEL - Abnormal; Notable for the following:       Result Value   Sodium 133 (*)    Chloride 98 (*)    Glucose, Bld 101 (*)    Total Bilirubin 1.7 (*)    All other components within normal limits  CBC WITH DIFFERENTIAL/PLATELET  URINALYSIS, ROUTINE W REFLEX MICROSCOPIC (NOT AT [facility])    EKG  EKG Interpretation None       Radiology Ct Abdomen  Pelvis W Contrast  Result Date: 05/18/2016 CLINICAL DATA:  Rectal pain with bright red blood per rectum. EXAM: CT ABDOMEN AND PELVIS WITH CONTRAST TECHNIQUE: Multidetector CT imaging of the abdomen and pelvis was performed using the standard protocol following bolus administration of intravenous contrast. CONTRAST:  ISOVUE-300 IOPAMIDOL (ISOVUE-300) INJECTION 61% COMPARISON:  11/17/2015. FINDINGS: Lower chest:  Unremarkable. Hepatobiliary: No focal abnormality within the liver parenchyma. There is no evidence for gallstones, gallbladder wall thickening, or pericholecystic fluid. No intrahepatic or extrahepatic biliary dilation. Pancreas: No focal mass lesion. No dilatation of the main duct. No intraparenchymal cyst. No peripancreatic edema. Spleen: No splenomegaly. No focal mass lesion. Adrenals/Urinary Tract: No adrenal nodule or mass. Stable 6 mm hypo attenuating lesion lower pole left kidney, likely a cyst. 5 mm low-density lesion upper pole left kidney also likely a cyst. Right kidney unremarkable. No evidence for hydroureter. The urinary bladder appears normal for the degree of distention. Stomach/Bowel: Stomach is nondistended. No gastric wall thickening. No evidence of outlet obstruction. Duodenum is normally positioned as is the ligament of Treitz. No small bowel wall thickening. No small bowel dilatation. The terminal ileum is normal. The appendix is normal. No gross colonic mass. No colonic wall thickening. No substantial diverticular change. Complex multilocular 4.5 x 3.3 cm perianal fluid collection contains gas. Imaging features are compatible with perianal abscess. Vascular/Lymphatic: No abdominal aortic aneurysm. No abdominal aortic atherosclerotic calcification. There is no gastrohepatic or hepatoduodenal ligament lymphadenopathy. No intraperitoneal or retroperitoneal lymphadenopathy. Insert no pelvic sidewall lymphadenopathy Reproductive: The prostate gland and seminal vesicles have normal  imaging features. Other: No intraperitoneal free fluid. Musculoskeletal: Bone windows reveal no worrisome lytic or sclerotic osseous lesions. IMPRESSION: Complex multiloculated perianal abscess. Electronically Signed   By: Kennith Center M.D.   On: 05/18/2016 11:14    Procedures Procedures (including critical care time)  Medications Ordered in ED Medications  diatrizoate meglumine-sodium (GASTROGRAFIN) 66-10 % solution (not administered)  HYDROcodone-acetaminophen (NORCO/VICODIN) 5-325 MG per tablet 1 tablet (1 tablet Oral Given 05/18/16 0909)  iopamidol (ISOVUE-300) 61 % injection 100 mL (100 mLs Intravenous Contrast Given 05/18/16 1039)  amoxicillin-clavulanate (AUGMENTIN) 875-125 MG per tablet 1 tablet (1 tablet Oral Given 05/18/16 1449)  oxyCODONE-acetaminophen (PERCOCET/ROXICET) 5-325 MG per tablet 1 tablet (1 tablet Oral Given 05/18/16 1449)     Initial Impression / Assessment and Plan / ED Course  I have reviewed the triage vital signs and the nursing notes.  Pertinent labs & imaging results that were available during my care of the patient were reviewed by me and considered in my medical decision making (see chart for details).  Clinical Course    Perianal abscess, surgery consulted and saw. No obvious area to drain, will follow up in the office. Pain control and abx provided.   Final Clinical Impressions(s) / ED Diagnoses   Final diagnoses:  Abscess and cellulitis    New Prescriptions New  Prescriptions   AMOXICILLIN-CLAVULANATE (AUGMENTIN) 875-125 MG TABLET    Take 1 tablet by mouth 2 (two) times daily. One po bid x 10 days   OXYCODONE-ACETAMINOPHEN (PERCOCET/ROXICET) 5-325 MG TABLET    Take 1-2 tablets by mouth every 6 (six) hours as needed for severe pain.    I personally performed the services described in this documentation, which was scribed in my presence. The recorded information has been reviewed and is accurate.    Marily MemosJason Alizandra Loh, MD 05/18/16 [phone]

## 2016-09-07 ENCOUNTER — Emergency Department (HOSPITAL_COMMUNITY)
Admission: EM | Admit: 2016-09-07 | Discharge: 2016-09-07 | Disposition: A | Discharge status: Home / Self Care | Payer: Medicare Other | Attending: Emergency Medicine | Admitting: Emergency Medicine

## 2016-09-07 ENCOUNTER — Encounter (HOSPITAL_COMMUNITY): Payer: Self-pay | Admitting: Emergency Medicine

## 2016-09-07 ENCOUNTER — Emergency Department (HOSPITAL_COMMUNITY): Payer: Medicare Other

## 2016-09-07 DIAGNOSIS — Y998 Other external cause status: Secondary | ICD-10-CM | POA: Insufficient documentation

## 2016-09-07 DIAGNOSIS — I10 Essential (primary) hypertension: Secondary | ICD-10-CM | POA: Diagnosis not present

## 2016-09-07 DIAGNOSIS — S63259A Unspecified dislocation of unspecified finger, initial encounter: Secondary | ICD-10-CM

## 2016-09-07 DIAGNOSIS — Z79899 Other long term (current) drug therapy: Secondary | ICD-10-CM | POA: Insufficient documentation

## 2016-09-07 DIAGNOSIS — S63619A Unspecified sprain of unspecified finger, initial encounter: Secondary | ICD-10-CM

## 2016-09-07 DIAGNOSIS — S6992XA Unspecified injury of left wrist, hand and finger(s), initial encounter: Secondary | ICD-10-CM | POA: Diagnosis present

## 2016-09-07 DIAGNOSIS — S62609A Fracture of unspecified phalanx of unspecified finger, initial encounter for closed fracture: Secondary | ICD-10-CM

## 2016-09-07 DIAGNOSIS — W2105XA Struck by basketball, initial encounter: Secondary | ICD-10-CM | POA: Insufficient documentation

## 2016-09-07 DIAGNOSIS — Y929 Unspecified place or not applicable: Secondary | ICD-10-CM | POA: Diagnosis not present

## 2016-09-07 DIAGNOSIS — Y9367 Activity, basketball: Secondary | ICD-10-CM | POA: Diagnosis not present

## 2016-09-07 DIAGNOSIS — S63285A Dislocation of proximal interphalangeal joint of left ring finger, initial encounter: Secondary | ICD-10-CM | POA: Diagnosis not present

## 2016-09-07 HISTORY — DX: Fracture of unspecified phalanx of unspecified finger, initial encounter for closed fracture: S62.609A

## 2016-09-07 MED ORDER — LIDOCAINE HCL (PF) 2 % IJ SOLN
2.0000 mL | Freq: Once | INTRAMUSCULAR | Status: AC
  Administered 2016-09-07: 2 mL
  Filled 2016-09-07: qty 10

## 2016-09-07 MED ORDER — HYDROCODONE-ACETAMINOPHEN 5-325 MG PO TABS
1.0000 | ORAL_TABLET | ORAL | 0 refills | Status: DC | PRN
Start: 2016-09-07 — End: 2017-09-17

## 2016-09-07 MED ORDER — HYDROCODONE-ACETAMINOPHEN 5-325 MG PO TABS
1.0000 | ORAL_TABLET | Freq: Once | ORAL | Status: AC
  Administered 2016-09-07: 1 via ORAL
  Filled 2016-09-07: qty 1

## 2016-09-07 MED ORDER — HYDROCODONE-ACETAMINOPHEN 5-325 MG PO TABS: 2.0000 | ORAL_TABLET | ORAL | 0 refills | Status: DC | PRN

## 2016-09-07 NOTE — Discharge Instructions (Signed)
As discussed, call Dr. Romeo AppleHarrison in the morning for an appointment time tomorrow for further management of your finger injury.  You may take the hydrocodone prescribed for pain relief.  This will make you drowsy - do not drive within 4 hours of taking this medication.

## 2016-09-07 NOTE — ED Provider Notes (Signed)
[facility]Y DEPT Provider Note   CSN: [id number] Arrival date & time: 09/07/16  1548     History   Chief Complaint Chief Complaint  Patient presents with  . Finger Injury    HPI Nicholas Dunlap is a 35 y.o. male presenting with a left ring finger injury occurring this morning around 6 am. He was playing basketball at the gym when the ball hit the tip of his finger causing pain, swelling and inability to flex the finger.  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  He denies numbness in the distal finger.  He is right handed.   The history is provided by the patient.    Past Medical History:  Diagnosis Date  . Hyperlipidemia   . Hypertension     Patient Active Problem List   Diagnosis Date Noted  . CLOSED FRACTURE UNSPEC PHALANX/PHALANGES HAND 08/01/2007    History reviewed. No pertinent surgical history.     Home Medications    Prior to Admission medications   Medication Sig Start Date End Date Taking? Authorizing Provider  Azilsartan-Chlorthalidone (EDARBYCLOR) 40-12.5 MG TABS Take 1 tablet by mouth every morning.   Yes Historical Provider, MD  rosuvastatin (CRESTOR) 20 MG tablet Take 20 mg by mouth daily.   Yes Historical Provider, MD  amoxicillin-clavulanate (AUGMENTIN) 875-125 MG tablet Take 1 tablet by mouth 2 (two) times daily. One po bid x 10 days Patient not taking: Reported on 09/07/2016 05/18/16   Marily Memos, MD  HYDROcodone-acetaminophen (NORCO/VICODIN) 5-325 MG tablet Take 1 tablet by mouth every 4 (four) hours as needed. 09/07/16   Burgess Amor, PA-C  HYDROcodone-acetaminophen (NORCO/VICODIN) 5-325 MG tablet Take 2 tablets by mouth every 4 (four) hours as needed. 09/07/16   Burgess Amor, PA-C  oxyCODONE-acetaminophen (PERCOCET/ROXICET) 5-325 MG tablet Take 1-2 tablets by mouth every 6 (six) hours as needed for severe pain. Patient not taking: Reported on 09/07/2016 05/18/16   Marily Memos, MD    Family History No family history on file.  Social History Social History  Substance Use Topics  . Smoking status: Never Smoker  . Smokeless tobacco: Never Used  . Alcohol use No     Allergies   Lactose intolerance (gi)   Review of Systems Review of Systems  Constitutional: Negative for fever.  Musculoskeletal: Positive for arthralgias and joint swelling. Negative for myalgias.  Neurological: Negative for weakness and numbness.     Physical Exam Updated Vital Signs BP 154/94 (BP Location: Left Arm)   Pulse 66   Temp 98.2 F (36.8 C) (Oral)   Resp 19   Ht 5\' 10"  (1.778 m)   Wt 99.8 kg   SpO2 100%   BMI 31.57 kg/m   Physical Exam  Constitutional: He appears well-developed and well-nourished.  HENT:  Head: Atraumatic.  Neck: Normal range of motion.  Cardiovascular:  Pulses equal bilaterally  Musculoskeletal: He exhibits edema, tenderness and deformity.       Left hand: He exhibits bony tenderness, deformity and swelling. He exhibits normal capillary refill. Normal sensation noted.  Pain, ttp and palpable deformity left right finger, pip joint.  Distal sensation intact with less than 2 sec distal cap refill.  Neurological: He is alert. He has normal strength. He displays normal reflexes. No sensory deficit.  Skin: Skin is warm and dry.  Psychiatric: He has a normal mood and affect.     ED Treatments / Results  Labs (all labs ordered are listed, but only abnormal results are displayed) Labs Reviewed - No data to display  EKG  EKG Interpretation None       Radiology Dg Finger Ring Left  Result Date: 09/07/2016 CLINICAL DATA:  35 year old male, post reduction of left hand fourth digit injury. EXAM: LEFT RING FINGER 2+V COMPARISON:  09/07/2016 FINDINGS: Persistent dorsal subluxation at the proximal interphalangeal joint of the left hand fourth digit. There remains approximately half shaft width displacement. Fracture fragments again  demonstrated. Mild soft tissue swelling. IMPRESSION: Persistent dorsal subluxation at the left hand fourth digit PIP, with approximately 1/2 shaft with displacement. Fracture fragments again seen with associated soft tissue swelling. Signed, Yvone NeuJaime S. Loreta AveWagner, DO [facility] Electronically Signed   By: Gilmer MorJaime  Wagner D.O.   On: 09/07/2016 20:57   Dg Finger Ring Left  Result Date: 09/07/2016 CLINICAL DATA:  Left ring finger dislocation, status post splinting EXAM: LEFT RING FINGER 2+V COMPARISON:  Left finger radiograph 09/07/2016 FINDINGS: There is persistent dorsal subluxation at the proximal interphalangeal joint of the left fourth digit with associated adjacent fracture fragments arising from the volar aspect of the base of middle phalanx. The degree of subluxation is slightly reduced. IMPRESSION: Persistent dorsal subluxation of the left fourth proximal interphalangeal joint with associated fracture of the base of the middle phalanx. Electronically Signed   By: Deatra RobinsonKevin  Herman M.D.   On: 09/07/2016 19:12   Dg Finger Ring Left  Result Date: 09/07/2016 CLINICAL DATA:  35 year old male with a history of basketball injury EXAM: LEFT RING FINGER 2+V COMPARISON:  None. FINDINGS: Acute fracture dislocation of the proximal interphalangeal joint of the left fourth digit. There is nearly 1 full width dorsal shaft displacement at PIP with associated swelling. There are 2 small fracture fragments at the volar aspect of the joint. The fracture sites appear to involve both the volar and dorsal aspect of the base of the middle phalanx. IMPRESSION: Acute fracture dislocation of the left fourth digit PIP, with dorsal displacement of the middle phalanx and 2 small fracture fragments from the base of the middle phalanx, as above. Associated soft tissue swelling. Signed, Yvone NeuJaime S. Loreta AveWagner, DO [facility]  Electronically Signed   By: Gilmer MorJaime  Wagner D.O.   On: 09/07/2016 17:37    Procedures .Splint Application Date/Time: 09/07/2016 7:12 PM Performed by: Burgess AmorIDOL, Braedyn Kauk Authorized by: Burgess AmorIDOL, Dionis Autry   Consent:    Consent obtained:  Verbal   Consent given by:  Patient Pre-procedure details:    Sensation:  Normal Procedure details:    Laterality:  Left   Location:  Finger   Finger:  L ring finger   Splint type:  Finger   Supplies:  Aluminum splint Post-procedure details:    Pain:  Unchanged   Sensation:  Normal   Patient tolerance of procedure:  Tolerated well, no immediate complications Reduction of dislocation Date/Time: 09/07/2016 7:14 PM Performed by: Burgess AmorIDOL, Teandre Hamre Authorized by: Burgess AmorIDOL, Hinton Luellen  Consent: Verbal consent obtained. Risks and benefits: risks, benefits and alternatives were discussed Consent given by: patient Patient understanding: patient states understanding of the procedure being performed Local anesthesia used: yes Anesthesia: digital block  Anesthesia: Local anesthesia used: yes Local Anesthetic: lidocaine 2% without epinephrine Anesthetic total: 2 mL  Sedation: Patient sedated: no Patient tolerance: Patient tolerated the procedure well with no immediate complications Comments: Attempt to reduce distal finger dislocation and was able to reduce but it would not stay in  position.  Also attempted by Dr. Estell HarpinZammit with similar result.     (including critical care time)  Medications Ordered in ED Medications  lidocaine (XYLOCAINE) 2 % injection 2 mL (2 mLs Other Given by Other 09/07/16 1847)  HYDROcodone-acetaminophen (NORCO/VICODIN) 5-325 MG per tablet 1 tablet (1 tablet Oral Given 09/07/16 2239)     Initial Impression / Assessment and Plan / ED Course  I have reviewed the triage vital signs and the nursing notes.  Pertinent labs & imaging results that were available during my care of the patient were reviewed by me and considered in my medical decision making  (see chart for details).  Clinical Course     Pt post reduction films still unreduced.  Spoke with Dr. Romeo AppleHarrison who recommended reducing with patient in flexion and deferred to hand surgery for definitive surgical repair of volar ligament.  This was completed, approx 30 degrees of flexion, partially reduced on repeat films.  Discussed with Dr. Janee Mornhompson, recommended further flexion, may need to be in 60-70 degree flexion.  Recommended Dr. Romeo AppleHarrison reduce the finger and then Dr. Janee Mornhompson can f/u with surgical repair.  Call back to Dr. Romeo AppleHarrison who states the finger is reduced adequately currently and would not recommend continuing to manipulate as it will cause more swelling, but would plan to see pt in follow up here as outpatient.  Attempted to contact Dr. Janee Mornhompson with updated plan, no return call received.  Advised pt to call Dr. Romeo AppleHarrison in the morning for an appt time tomorrow.  Final Clinical Impressions(s) / ED Diagnoses   Final diagnoses:  Dislocation of finger, initial encounter  Complete tear of finger ligament, initial encounter    New Prescriptions New Prescriptions   HYDROCODONE-ACETAMINOPHEN (NORCO/VICODIN) 5-325 MG TABLET    Take 1 tablet by mouth every 4 (four) hours as needed.   HYDROCODONE-ACETAMINOPHEN (NORCO/VICODIN) 5-325 MG TABLET    Take 2 tablets by mouth every 4 (four) hours as needed.     Burgess AmorJulie Maralee Higuchi, PA-C 09/07/16 [id number]    Bethann BerkshireJoseph Zammit, MD 09/12/16 [phone]

## 2016-09-07 NOTE — ED Notes (Signed)
Pt verbalized understanding of no driving and to use caution within 4 hours of taking pain meds due to meds cause drowsiness 

## 2016-09-07 NOTE — ED Notes (Signed)
Pt returned from xray

## 2016-09-07 NOTE — ED Triage Notes (Signed)
Patient states injuring left ring finger while playing basketball.

## 2016-09-09 ENCOUNTER — Encounter: Payer: Self-pay | Admitting: Orthopedic Surgery

## 2016-09-09 ENCOUNTER — Ambulatory Visit (INDEPENDENT_AMBULATORY_CARE_PROVIDER_SITE_OTHER): Payer: Medicaid Other | Admitting: Orthopedic Surgery

## 2016-09-09 DIAGNOSIS — S62609A Fracture of unspecified phalanx of unspecified finger, initial encounter for closed fracture: Secondary | ICD-10-CM

## 2016-09-09 NOTE — Patient Instructions (Signed)
Recommend referral to Dr. Janee Mornhompson for evaluation and treatment

## 2016-09-09 NOTE — Progress Notes (Signed)
Patient ID: Nicholas Dunlap, male   DOB: 1981-01-15, 35 y.o.   MRN: [id number]  Chief Complaint  Patient presents with  . Hand Injury    left ring finger dislocation, DOI 09/07/16    HPI Nicholas ClearMichael C Hopman is a 35 y.o. male.  35 year old male injured his finger month ago than injured it again on December 13 playing basketball each occasion. He said the first time was just jammed and he thought he could deal with it but it hurt it again on the 13th became more swollen and more painful he went to the emergency room had 2 attempted closed reductions unsuccessfully  Patient was discussed with me I recommended he see the hands specialist Dr. Janee Mornhompson who was on-call. The message was relayed that they would call him the next day which they did not and he presents to our office for evaluation and treatment  Review of Systems Review of Systems  Constitutional: Negative for fever.  Neurological: Negative for numbness.     Past Medical History:  Diagnosis Date  . Hyperlipidemia   . Hypertension     No past surgical history on file.  Social History Social History  Substance Use Topics  . Smoking status: Never Smoker  . Smokeless tobacco: Never Used  . Alcohol use No    Allergies  Allergen Reactions  . Lactose Intolerance (Gi) Other (See Comments)    Upsets stomach    Current Meds  Medication Sig  . HYDROcodone-acetaminophen (NORCO/VICODIN) 5-325 MG tablet Take 1 tablet by mouth every 4 (four) hours as needed.  . rosuvastatin (CRESTOR) 20 MG tablet Take 20 mg by mouth daily.      Physical Exam Physical Exam There were no vitals taken for this visit.  Gen. appearance. The patient is well-developed and well-nourished, grooming and hygiene are normal. There are no gross congenital abnormalities  The patient is alert and oriented to person place and time  Mood and affect are normal  Ambulation No disturbances.  Examination reveals the following: On inspection we find  swelling of the PIP joint of the left ring finger.  With the range of motion of  eye cannot get any range of motion in the finger in extension  Stability tests were attempted but unsuccessful  Motor exam no atrophy  Skin exam no rash ulceration or erythema  Sensation is normal pulse and perfusion normal in the hand including the digit in question   Data Reviewed 3 sets of images all dated the summer 13th  All show dislocation or subluxation of the PIP joint. Initial films showed a volar fracture dislocation with volar fragments making a dorsal dislocation. First reduction was unsuccessful as was the second although there is a partial reduction on the second.  Assessment    PIP joint fracture dislocation left ring finger    Plan    Our offices calling Dr. Carollee Massedhompson's office for evaluation and treatment. The reduction was not successful because of the chronicity of the injury.       Fuller CanadaStanley Ceylon Arenson 09/09/2016, 9:05 AM

## 2016-09-12 ENCOUNTER — Encounter (HOSPITAL_BASED_OUTPATIENT_CLINIC_OR_DEPARTMENT_OTHER): Payer: Self-pay | Admitting: *Deleted

## 2016-09-12 ENCOUNTER — Other Ambulatory Visit: Payer: Self-pay | Admitting: Orthopedic Surgery

## 2016-09-12 MED FILL — Hydrocodone-Acetaminophen Tab 5-325 MG: ORAL | Qty: 6 | Status: AC

## 2016-09-12 NOTE — H&P (Signed)
Nicholas Dunlap is an 35 y.o. male.   CC / Reason for Visit: Left ring finger injury HPI: This patient is a 35 year old male with cerebral palsy affecting his left side who presents for evaluation of his left ring finger.  He injured this playing basketball on the date above, [facility], where the emergency department staff recognized a fracture dislocation of the ring finger PIP joint, with dorsal displacement, and attempted closed reduction of the digit twice, with neither x-ray revealing acceptable reduction of the dislocation.  Neither x-ray was performed with the digit significantly flexed at the PIP joint.  They were apparently in consultation with Dr. Romeo AppleHarrison, who recommended discharging the patient from the emergency department with a dislocated digit.  The patient presented to Dr. Mort SawyersHarrison's clinic for further evaluation and treatment on 09-09-16.  He reports that his splint was removed and then reapplied.  Dr. Mort SawyersHarrison's office apparently made arrangements for today's appointment with me.  The patient indicates that he did injure his finger a month ago, but that it was significantly worsened on the 13th.  He reports that his baseline ability to flex and extend the ring and small fingers on that hand is limited compared to the others due to his cerebral palsy   Past Medical History:  Diagnosis Date  . Cerebral palsy (HCC)   . Eczema    chest, buttock  . Finger fracture, left 09/07/2016   fx./dislocation ring finger  . Hyperlipidemia   . Limp     Past Surgical History:  Procedure Laterality Date  . FOOT CAPSULE RELEASE W/ PERCUTANEOUS HEEL CORD LENGTHENING, TIBIAL TENDON TRANSFER Left    as a child    History reviewed. No pertinent family history. Social History:  reports that he has never smoked. He has never used smokeless tobacco. He reports that he does not drink alcohol or use drugs.  Allergies:  Allergies  Allergen Reactions   . Milk-Related Compounds Shortness Of Breath    No prescriptions prior to admission.    No results found for this or any previous visit (from the past 48 hour(s)). No results found.  Review of Systems  All other systems reviewed and are negative.   Height 5\' 10"  (1.778 m), weight 103.4 kg (228 lb). Physical Exam  Constitutional:  WD, WN, NAD HEENT:  NCAT, EOMI Neuro/Psych:  Alert & oriented to person, place, and time; appropriate mood & affect Lymphatic: No generalized UE edema or lymphadenopathy Extremities / MSK:  Both UE are normal with respect to appearance, ranges of motion, joint stability, muscle strength/tone, sensation, & perfusion except as otherwise noted:  On the left hand, he has good active flexion of the thumb, index, and long fingers.  He can flex the ring and small fingers, with little active motion at the PIP joint of the ring finger, but I believe the FDS and FDP both fire.  The PIP joint is swollen and enlarged.  Intact light touch sensibility on the radial ulnar aspects of the tip.  Labs / Xrays:  No radiographic studies obtained today.  2 views of the left ring finger obtained by me fluoroscopically reveals a dorsal fracture dislocation at the PIP joint with a small volar lip fracture at the base of the middle phalanx.  There is 100% dorsal displacement  Assessment: Dorsally displaced PIP fracture dislocation of the left ring finger, having been dislocated since at least 09-07-16  Plan:  I discussed these findings with him  and the less than optimal sequence of events that has occurred.  I indicated that this would require operative treatment and likely leave him with significant long-term deficits of the digit, hopefully with a stable but likely stiff PIP joint.  We were able to successfully lacing onto the surgery schedule for tomorrow for a planned volar plate arthroplasty.  The details of the operative procedure were discussed with the patient.  Questions were  invited and answered.  In addition to the goal of the procedure, the risks of the procedure to include but not limited to bleeding; infection; damage to the nerves or blood vessels that could result in bleeding, numbness, weakness, chronic pain, and the need for additional procedures; stiffness; the need for revision surgery; and anesthetic risks were reviewed.  No specific outcome was guaranteed or implied.  Informed consent was obtained.  Ninfa Giannelli A., MD 09/12/2016, 6:28 PM

## 2016-09-13 ENCOUNTER — Encounter (HOSPITAL_BASED_OUTPATIENT_CLINIC_OR_DEPARTMENT_OTHER)
Admission: RE | Disposition: A | Discharge status: Home / Self Care | Payer: Self-pay | Source: Ambulatory Visit | Attending: Orthopedic Surgery

## 2016-09-13 ENCOUNTER — Ambulatory Visit (HOSPITAL_BASED_OUTPATIENT_CLINIC_OR_DEPARTMENT_OTHER): Payer: Medicare Other | Admitting: Certified Registered"

## 2016-09-13 ENCOUNTER — Encounter (HOSPITAL_BASED_OUTPATIENT_CLINIC_OR_DEPARTMENT_OTHER): Payer: Self-pay

## 2016-09-13 ENCOUNTER — Ambulatory Visit (HOSPITAL_COMMUNITY): Payer: Medicare Other

## 2016-09-13 ENCOUNTER — Ambulatory Visit (HOSPITAL_BASED_OUTPATIENT_CLINIC_OR_DEPARTMENT_OTHER)
Admission: RE | Admit: 2016-09-13 | Discharge: 2016-09-13 | Disposition: A | Discharge status: Home / Self Care | Payer: Medicare Other | Source: Ambulatory Visit | Attending: Orthopedic Surgery | Admitting: Orthopedic Surgery

## 2016-09-13 DIAGNOSIS — S62625A Displaced fracture of medial phalanx of left ring finger, initial encounter for closed fracture: Secondary | ICD-10-CM | POA: Diagnosis not present

## 2016-09-13 DIAGNOSIS — Z6833 Body mass index (BMI) 33.0-33.9, adult: Secondary | ICD-10-CM | POA: Insufficient documentation

## 2016-09-13 DIAGNOSIS — X58XXXA Exposure to other specified factors, initial encounter: Secondary | ICD-10-CM | POA: Diagnosis not present

## 2016-09-13 DIAGNOSIS — E785 Hyperlipidemia, unspecified: Secondary | ICD-10-CM | POA: Diagnosis not present

## 2016-09-13 DIAGNOSIS — E669 Obesity, unspecified: Secondary | ICD-10-CM | POA: Insufficient documentation

## 2016-09-13 DIAGNOSIS — G809 Cerebral palsy, unspecified: Secondary | ICD-10-CM | POA: Diagnosis not present

## 2016-09-13 DIAGNOSIS — Z419 Encounter for procedure for purposes other than remedying health state, unspecified: Secondary | ICD-10-CM

## 2016-09-13 HISTORY — DX: Dermatitis, unspecified: L30.9

## 2016-09-13 HISTORY — PX: FINGER ARTHROPLASTY: SHX5017

## 2016-09-13 HISTORY — DX: Fracture of unspecified phalanx of unspecified finger, initial encounter for closed fracture: S62.609A

## 2016-09-13 HISTORY — DX: Other abnormalities of gait and mobility: R26.89

## 2016-09-13 HISTORY — DX: Cerebral palsy, unspecified: G80.9

## 2016-09-13 SURGERY — ARTHROPLASTY, FINGER
Anesthesia: General | Site: Finger | Laterality: Left

## 2016-09-13 MED ORDER — EPHEDRINE 5 MG/ML INJ
INTRAVENOUS | Status: AC
  Filled 2016-09-13: qty 20

## 2016-09-13 MED ORDER — CEFAZOLIN SODIUM-DEXTROSE 2-4 GM/100ML-% IV SOLN
INTRAVENOUS | Status: AC
  Filled 2016-09-13: qty 100

## 2016-09-13 MED ORDER — FENTANYL CITRATE (PF) 100 MCG/2ML IJ SOLN
50.0000 ug | INTRAMUSCULAR | Status: DC | PRN
  Administered 2016-09-13: 100 ug via INTRAVENOUS

## 2016-09-13 MED ORDER — LACTATED RINGERS IV SOLN
INTRAVENOUS | Status: DC
  Administered 2016-09-13 (×2): via INTRAVENOUS

## 2016-09-13 MED ORDER — MIDAZOLAM HCL 2 MG/2ML IJ SOLN
INTRAMUSCULAR | Status: AC
  Filled 2016-09-13: qty 2

## 2016-09-13 MED ORDER — BUPIVACAINE-EPINEPHRINE (PF) 0.25% -1:200000 IJ SOLN
INTRAMUSCULAR | Status: DC | PRN
  Administered 2016-09-13: 5 mL

## 2016-09-13 MED ORDER — ACETAMINOPHEN 325 MG PO TABS: 650.0000 mg | ORAL_TABLET | Freq: Four times a day (QID) | ORAL | Status: DC | PRN

## 2016-09-13 MED ORDER — PROPOFOL 10 MG/ML IV BOLUS
INTRAVENOUS | Status: DC | PRN
  Administered 2016-09-13: 200 mg via INTRAVENOUS

## 2016-09-13 MED ORDER — FENTANYL CITRATE (PF) 100 MCG/2ML IJ SOLN: 25.0000 ug | INTRAMUSCULAR | Status: DC | PRN

## 2016-09-13 MED ORDER — PROMETHAZINE HCL 25 MG/ML IJ SOLN: 6.2500 mg | INTRAMUSCULAR | Status: DC | PRN

## 2016-09-13 MED ORDER — DEXAMETHASONE SODIUM PHOSPHATE 10 MG/ML IJ SOLN
INTRAMUSCULAR | Status: DC | PRN
  Administered 2016-09-13: 4 mg via INTRAVENOUS

## 2016-09-13 MED ORDER — MIDAZOLAM HCL 2 MG/2ML IJ SOLN
1.0000 mg | INTRAMUSCULAR | Status: DC | PRN
  Administered 2016-09-13: 2 mg via INTRAVENOUS

## 2016-09-13 MED ORDER — LIDOCAINE HCL (CARDIAC) 20 MG/ML IV SOLN
INTRAVENOUS | Status: DC | PRN
  Administered 2016-09-13: 60 mg via INTRAVENOUS

## 2016-09-13 MED ORDER — LACTATED RINGERS IV SOLN: INTRAVENOUS | Status: DC

## 2016-09-13 MED ORDER — FENTANYL CITRATE (PF) 100 MCG/2ML IJ SOLN
INTRAMUSCULAR | Status: AC
  Filled 2016-09-13: qty 2

## 2016-09-13 MED ORDER — IBUPROFEN 200 MG PO TABS: 600.0000 mg | ORAL_TABLET | Freq: Four times a day (QID) | ORAL | 0 refills | Status: DC | PRN

## 2016-09-13 MED ORDER — CEFAZOLIN SODIUM-DEXTROSE 2-4 GM/100ML-% IV SOLN
2.0000 g | INTRAVENOUS | Status: AC
  Administered 2016-09-13: 2 g via INTRAVENOUS

## 2016-09-13 MED ORDER — LIDOCAINE HCL 1 % IJ SOLN
INTRAMUSCULAR | Status: DC | PRN
  Administered 2016-09-13: 5 mL

## 2016-09-13 MED ORDER — PROPOFOL 10 MG/ML IV BOLUS
INTRAVENOUS | Status: AC
  Filled 2016-09-13: qty 20

## 2016-09-13 MED ORDER — SCOPOLAMINE 1 MG/3DAYS TD PT72: 1.0000 | MEDICATED_PATCH | Freq: Once | TRANSDERMAL | Status: DC | PRN

## 2016-09-13 MED ORDER — OXYCODONE HCL 5 MG PO TABS: 5.0000 mg | ORAL_TABLET | Freq: Four times a day (QID) | ORAL | 0 refills | Status: DC | PRN

## 2016-09-13 SURGICAL SUPPLY — 48 items
ANCH SUT 3-0 MN 1 LD NDL (Anchor) ×2 IMPLANT
ANCHOR JUGGERKNOT 1.0 3-0 NLD (Anchor) ×4 IMPLANT
BLADE SURG 15 STRL LF DISP TIS (BLADE) ×1 IMPLANT
BLADE SURG 15 STRL SS (BLADE) ×3
BNDG CMPR 9X4 STRL LF SNTH (GAUZE/BANDAGES/DRESSINGS) ×1
BNDG COHESIVE 4X5 TAN STRL (GAUZE/BANDAGES/DRESSINGS) ×3 IMPLANT
BNDG ESMARK 4X9 LF (GAUZE/BANDAGES/DRESSINGS) ×2 IMPLANT
BNDG GAUZE ELAST 4 BULKY (GAUZE/BANDAGES/DRESSINGS) ×3 IMPLANT
CHLORAPREP W/TINT 26ML (MISCELLANEOUS) ×3 IMPLANT
CORDS BIPOLAR (ELECTRODE) ×3 IMPLANT
COVER BACK TABLE 60X90IN (DRAPES) ×3 IMPLANT
COVER MAYO STAND STRL (DRAPES) ×3 IMPLANT
CUFF TOURNIQUET SINGLE 18IN (TOURNIQUET CUFF) ×2 IMPLANT
DRAPE C-ARM 42X72 X-RAY (DRAPES) ×3 IMPLANT
DRAPE EXTREMITY T 121X128X90 (DRAPE) ×3 IMPLANT
DRAPE SURG 17X23 STRL (DRAPES) ×3 IMPLANT
DRSG EMULSION OIL 3X3 NADH (GAUZE/BANDAGES/DRESSINGS) ×3 IMPLANT
GLOVE BIO SURGEON STRL SZ7.5 (GLOVE) ×3 IMPLANT
GLOVE BIOGEL PI IND STRL 7.0 (GLOVE) ×1 IMPLANT
GLOVE BIOGEL PI IND STRL 8 (GLOVE) ×1 IMPLANT
GLOVE BIOGEL PI INDICATOR 7.0 (GLOVE) ×4
GLOVE BIOGEL PI INDICATOR 8 (GLOVE) ×2
GLOVE ECLIPSE 6.5 STRL STRAW (GLOVE) ×5 IMPLANT
GOWN STRL REUS W/ TWL LRG LVL3 (GOWN DISPOSABLE) ×2 IMPLANT
GOWN STRL REUS W/TWL LRG LVL3 (GOWN DISPOSABLE) ×6
GOWN STRL REUS W/TWL XL LVL3 (GOWN DISPOSABLE) ×3 IMPLANT
K-WIRE .045X4 (WIRE) ×2 IMPLANT
NDL HYPO 25X1 1.5 SAFETY (NEEDLE) IMPLANT
NEEDLE HYPO 25X1 1.5 SAFETY (NEEDLE) ×3 IMPLANT
NS IRRIG 1000ML POUR BTL (IV SOLUTION) ×3 IMPLANT
PACK BASIN DAY SURGERY FS (CUSTOM PROCEDURE TRAY) ×3 IMPLANT
PADDING CAST ABS 3INX4YD NS (CAST SUPPLIES)
PADDING CAST ABS 4INX4YD NS (CAST SUPPLIES) ×2
PADDING CAST ABS COTTON 3X4 (CAST SUPPLIES) IMPLANT
PADDING CAST ABS COTTON 4X4 ST (CAST SUPPLIES) ×1 IMPLANT
SLEEVE SCD COMPRESS KNEE MED (MISCELLANEOUS) ×2 IMPLANT
SPLINT PLASTER CAST XFAST 3X15 (CAST SUPPLIES) IMPLANT
SPLINT PLASTER CAST XFAST 4X15 (CAST SUPPLIES) ×1 IMPLANT
SPLINT PLASTER XTRA FAST SET 4 (CAST SUPPLIES) ×2
SPLINT PLASTER XTRA FASTSET 3X (CAST SUPPLIES)
SPONGE GAUZE 4X4 12PLY STER LF (GAUZE/BANDAGES/DRESSINGS) ×3 IMPLANT
STOCKINETTE 6  STRL (DRAPES) ×2
STOCKINETTE 6 STRL (DRAPES) ×1 IMPLANT
SUT VICRYL RAPIDE 4/0 PS 2 (SUTURE) ×2 IMPLANT
SYR 10ML LL (SYRINGE) ×2 IMPLANT
SYR BULB 3OZ (MISCELLANEOUS) ×3 IMPLANT
TOWEL OR 17X24 6PK STRL BLUE (TOWEL DISPOSABLE) ×3 IMPLANT
UNDERPAD 30X30 (UNDERPADS AND DIAPERS) ×3 IMPLANT

## 2016-09-13 NOTE — Anesthesia Preprocedure Evaluation (Signed)
Anesthesia Evaluation  Patient identified by MRN, date of birth, ID band Patient awake    Reviewed: Allergy & Precautions, NPO status , Patient's Chart, lab work & pertinent test results  Airway Mallampati: II  TM Distance: >3 FB Neck ROM: Full    Dental  (+) Teeth Intact, Dental Advisory Given   Pulmonary neg pulmonary ROS,    Pulmonary exam normal breath sounds clear to auscultation       Cardiovascular Exercise Tolerance: Good Normal cardiovascular exam Rhythm:Regular Rate:Normal  HLD   Neuro/Psych Cerebral palsy  negative psych ROS   GI/Hepatic negative GI ROS, Neg liver ROS,   Endo/Other  Obesity   Renal/GU negative Renal ROS     Musculoskeletal negative musculoskeletal ROS (+)   Abdominal   Peds  Hematology negative hematology ROS (+)   Anesthesia Other Findings Day of surgery medications reviewed with the patient.  Reproductive/Obstetrics                             Anesthesia Physical Anesthesia Plan  ASA: II  Anesthesia Plan: General   Post-op Pain Management:    Induction: Intravenous  Airway Management Planned: LMA  Additional Equipment:   Intra-op Plan:   Post-operative Plan: Extubation in OR  Informed Consent: I have reviewed the patients History and Physical, chart, labs and discussed the procedure including the risks, benefits and alternatives for the proposed anesthesia with the patient or authorized representative who has indicated his/her understanding and acceptance.   Dental advisory given  Plan Discussed with: CRNA  Anesthesia Plan Comments: (Risks/benefits of general anesthesia discussed with patient including risk of damage to teeth, lips, gum, and tongue, nausea/vomiting, allergic reactions to medications, and the possibility of heart attack, stroke and death.  All patient questions answered.  Patient wishes to proceed.)        Anesthesia  Quick Evaluation

## 2016-09-13 NOTE — Interval H&P Note (Signed)
History and Physical Interval Note:  09/13/2016 1:11 PM  Nicholas Dunlap  has presented today for surgery, with the diagnosis of LEFT RING FINGER FRACTURE-DISLOCAT  The various methods of treatment have been discussed with the patient and family. After consideration of risks, benefits and other options for treatment, the patient has consented to  Procedure(s): LEFT RING FINGER VOLAR-PLATE REPAIR (Left) as a surgical intervention .  The patient's history has been reviewed, patient examined, no change in status, stable for surgery.  I have reviewed the patient's chart and labs.  Questions were answered to the patient's satisfaction.     Manus Weedman A.

## 2016-09-13 NOTE — Transfer of Care (Signed)
Immediate Anesthesia Transfer of Care Note  Patient: Nicholas Dunlap  Procedure(s) Performed: Procedure(s): LEFT RING FINGER VOLAR-PLATE REPAIR (Left)  Patient Location: PACU  Anesthesia Type:General  Level of Consciousness: sedated  Airway & Oxygen Therapy: Patient Spontanous Breathing and Patient connected to face mask oxygen  Post-op Assessment: Report given to RN and Post -op Vital signs reviewed and stable  Post vital signs: Reviewed and stable  Last Vitals:  Vitals:   09/13/16 1316 09/13/16 1525  BP: (!) 142/90 126/83  Pulse: (!) 55   Resp: 16   Temp: 36.8 C     Last Pain:  Vitals:   09/13/16 1316  TempSrc: Oral  PainSc: 6       Patients Stated Pain Goal: 1 (09/13/16 1316)  Complications: No apparent anesthesia complications

## 2016-09-13 NOTE — Anesthesia Postprocedure Evaluation (Signed)
Anesthesia Post Note  Patient: Nicholas Dunlap  Procedure(s) Performed: Procedure(s) (LRB): LEFT RING FINGER VOLAR-PLATE REPAIR (Left)  Patient location during evaluation: PACU Anesthesia Type: General Level of consciousness: awake and alert Pain management: pain level controlled Vital Signs Assessment: post-procedure vital signs reviewed and stable Respiratory status: spontaneous breathing, nonlabored ventilation, respiratory function stable and patient connected to nasal cannula oxygen Cardiovascular status: blood pressure returned to baseline and stable Postop Assessment: no signs of nausea or vomiting Anesthetic complications: no       Last Vitals:  Vitals:   09/13/16 1618 09/13/16 1625  BP:    Pulse: (!) 53 (!) 54  Resp: 10 17  Temp:      Last Pain:  Vitals:   09/13/16 1528  TempSrc:   PainSc: Asleep                 Cecile HearingStephen Edward Ankith [place]

## 2016-09-13 NOTE — Anesthesia Procedure Notes (Signed)
Procedure Name: LMA Insertion Date/Time: 09/13/2016 12:18 PM Performed by: Krishan Mcbreen D Pre-anesthesia Checklist: Patient identified, Emergency Drugs available, Suction available and Patient being monitored Patient Re-evaluated:Patient Re-evaluated prior to inductionOxygen Delivery Method: Circle system utilized Preoxygenation: Pre-oxygenation with 100% oxygen Intubation Type: IV induction Ventilation: Mask ventilation without difficulty LMA: LMA inserted LMA Size: 4.0 Number of attempts: 1 Airway Equipment and Method: Bite block Placement Confirmation: positive ETCO2 Tube secured with: Tape Dental Injury: Teeth and Oropharynx as per pre-operative assessment
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
## 2016-09-13 NOTE — Discharge Instructions (Signed)
Discharge Instructions   You have a dressing with a plaster splint incorporated in it. Move your fingers as much as possible, making a full fist and fully opening the fist. Elevate your hand above your elbow to reduce pain & swelling of the digits.  Ice over the operative site or in the arm pit may be helpful to reduce pain & swelling.  DO NOT USE HEAT. Pain medicine has been prescribed for you.  Take 600 mg ibuprofen OTC every 6 hours. Take Tylenol as stated on the bottle  as well.  If you have severe breakthrough pain, you can utilize the pain medicine prescribed to you. Leave the dressing in place until you return to our office.  You may shower, but keep the bandage clean & dry.  You may drive a car when you are off of prescription pain medications and can safely control your vehicle with both hands. Call our office to schedule a follow up appointment for 10-15 days from the date of surgery. No left handed work.    Please call [phone] during normal business hours or [phone] after hours for any problems. Including the following:  - excessive redness of the incisions - drainage for more than 4 days - fever of more than 101.5 F  *Please note that pain medications will not be refilled after hours or on weekends.   Post Anesthesia Home Care Instructions  Activity: Get plenty of rest for the remainder of the day. A responsible adult should stay with you for 24 hours following the procedure.  For the next 24 hours, DO NOT: -Drive a car -Advertising copywriterperate machinery -Drink alcoholic beverages -Take any medication unless instructed by your physician -Make any legal decisions or sign important papers.  Meals: Start with liquid foods such as gelatin or soup. Progress to regular foods as tolerated. Avoid greasy, spicy, heavy foods. If nausea and/or vomiting occur, drink only clear liquids until the nausea and/or vomiting subsides. Call your physician if vomiting continues.  Special  Instructions/Symptoms: Your throat may feel dry or sore from the anesthesia or the breathing tube placed in your throat during surgery. If this causes discomfort, gargle with warm salt water. The discomfort should disappear within 24 hours.  If you had a scopolamine patch placed behind your ear for the management of post- operative nausea and/or vomiting:  1. The medication in the patch is effective for 72 hours, after which it should be removed.  Wrap patch in a tissue and discard in the trash. Wash hands thoroughly with soap and water. 2. You may remove the patch earlier than 72 hours if you experience unpleasant side effects which may include dry mouth, dizziness or visual disturbances. 3. Avoid touching the patch. Wash your hands with soap and water after contact with the patch.   Call your surgeon if you experience:   1.  Fever over 101.0. 2.  Inability to urinate. 3.  Nausea and/or vomiting. 4.  Extreme swelling or bruising at the surgical site. 5.  Continued bleeding from the incision. 6.  Increased pain, redness or drainage from the incision. 7.  Problems related to your pain medication. 8.  Any problems and/or concerns

## 2016-09-14 ENCOUNTER — Encounter (HOSPITAL_BASED_OUTPATIENT_CLINIC_OR_DEPARTMENT_OTHER): Payer: Self-pay | Admitting: Orthopedic Surgery

## 2017-09-17 ENCOUNTER — Other Ambulatory Visit: Payer: Self-pay

## 2017-09-17 ENCOUNTER — Emergency Department (HOSPITAL_COMMUNITY): Payer: Medicare Other

## 2017-09-17 ENCOUNTER — Emergency Department (HOSPITAL_COMMUNITY)
Admission: EM | Admit: 2017-09-17 | Discharge: 2017-09-18 | Disposition: A | Discharge status: Home / Self Care | Payer: Medicare Other | Attending: Emergency Medicine | Admitting: Emergency Medicine

## 2017-09-17 ENCOUNTER — Encounter (HOSPITAL_COMMUNITY): Payer: Self-pay | Admitting: Emergency Medicine

## 2017-09-17 DIAGNOSIS — Z79899 Other long term (current) drug therapy: Secondary | ICD-10-CM | POA: Insufficient documentation

## 2017-09-17 DIAGNOSIS — Z23 Encounter for immunization: Secondary | ICD-10-CM | POA: Insufficient documentation

## 2017-09-17 DIAGNOSIS — S0591XA Unspecified injury of right eye and orbit, initial encounter: Secondary | ICD-10-CM | POA: Diagnosis present

## 2017-09-17 DIAGNOSIS — H1131 Conjunctival hemorrhage, right eye: Secondary | ICD-10-CM | POA: Insufficient documentation

## 2017-09-17 DIAGNOSIS — Y9389 Activity, other specified: Secondary | ICD-10-CM | POA: Insufficient documentation

## 2017-09-17 DIAGNOSIS — Y998 Other external cause status: Secondary | ICD-10-CM | POA: Insufficient documentation

## 2017-09-17 DIAGNOSIS — Y929 Unspecified place or not applicable: Secondary | ICD-10-CM | POA: Insufficient documentation

## 2017-09-17 DIAGNOSIS — G809 Cerebral palsy, unspecified: Secondary | ICD-10-CM | POA: Insufficient documentation

## 2017-09-17 DIAGNOSIS — S0501XA Injury of conjunctiva and corneal abrasion without foreign body, right eye, initial encounter: Secondary | ICD-10-CM | POA: Diagnosis not present

## 2017-09-17 MED ORDER — FLUORESCEIN SODIUM 1 MG OP STRP
1.0000 | ORAL_STRIP | Freq: Once | OPHTHALMIC | Status: AC
  Administered 2017-09-17: 1 via OPHTHALMIC
  Filled 2017-09-17: qty 1

## 2017-09-17 MED ORDER — TETRACAINE HCL 0.5 % OP SOLN
2.0000 [drp] | Freq: Once | OPHTHALMIC | Status: AC
  Administered 2017-09-17: 2 [drp] via OPHTHALMIC
  Filled 2017-09-17: qty 4
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
## 2017-09-17 NOTE — ED Notes (Addendum)
Visual Acuity Screening OU (both eyes) 20/50 OS (left) 20/50 OD (right) 20/40
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
## 2017-09-17 NOTE — ED Notes (Addendum)
Pt at CT/xray

## 2017-09-18 MED ORDER — TETANUS-DIPHTH-ACELL PERTUSSIS 5-2.5-18.5 LF-MCG/0.5 IM SUSP
0.5000 mL | Freq: Once | INTRAMUSCULAR | Status: AC
  Administered 2017-09-18: 0.5 mL via INTRAMUSCULAR
  Filled 2017-09-18: qty 0.5

## 2017-09-18 MED ORDER — ERYTHROMYCIN 5 MG/GM OP OINT
TOPICAL_OINTMENT | Freq: Once | OPHTHALMIC | Status: AC
  Administered 2017-09-18: 01:00:00 via OPHTHALMIC
  Filled 2017-09-18: qty 3.5

## 2017-09-18 NOTE — ED Notes (Signed)
Pt alert & oriented x4, stable gait. Patient given discharge instructions, paperwork & prescription(s). Patient  instructed to stop at the registration desk to finish any additional paperwork. Patient verbalized understanding. Pt left department w/ no further questions. 

## 2017-09-18 NOTE — Discharge Instructions (Signed)
Apply the antibiotic ointment to your right eye 4 times daily for the next 5 days.  Get rechecked by your doctor for any persistent or worsening symptoms.  It will take about 2 weeks for the reddened sclera of your eye to completely resolve.  Get rechecked for any changes including worse pain or any vision changes.

## 2017-11-23 ENCOUNTER — Ambulatory Visit (HOSPITAL_COMMUNITY)
Admission: RE | Admit: 2017-11-23 | Discharge: 2017-11-23 | Disposition: A | Discharge status: Home / Self Care | Payer: Medicare Other | Source: Ambulatory Visit | Attending: Family Medicine | Admitting: Family Medicine

## 2017-11-23 ENCOUNTER — Other Ambulatory Visit (HOSPITAL_COMMUNITY): Payer: Self-pay | Admitting: Family Medicine

## 2017-11-23 DIAGNOSIS — M1711 Unilateral primary osteoarthritis, right knee: Secondary | ICD-10-CM | POA: Diagnosis not present

## 2017-11-23 DIAGNOSIS — M158 Other polyosteoarthritis: Secondary | ICD-10-CM | POA: Diagnosis present

## 2018-02-07 IMAGING — DX DG FINGER RING 2+V*L*
3 series · 3 of 3 positions shown · non-contrast
Comparison: None.

CLINICAL DATA: 35-year-old male with a history of basketball injury

EXAM:
LEFT RING FINGER 2+V

[finger ap]
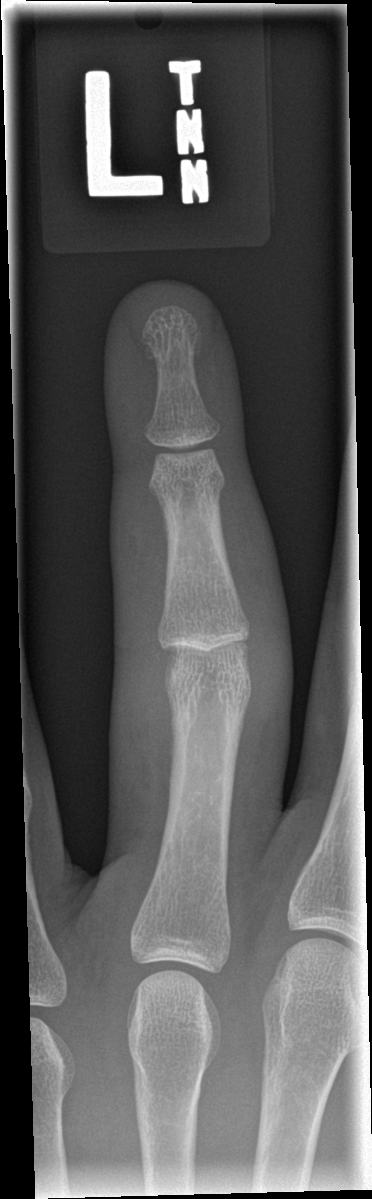

[finger obl]
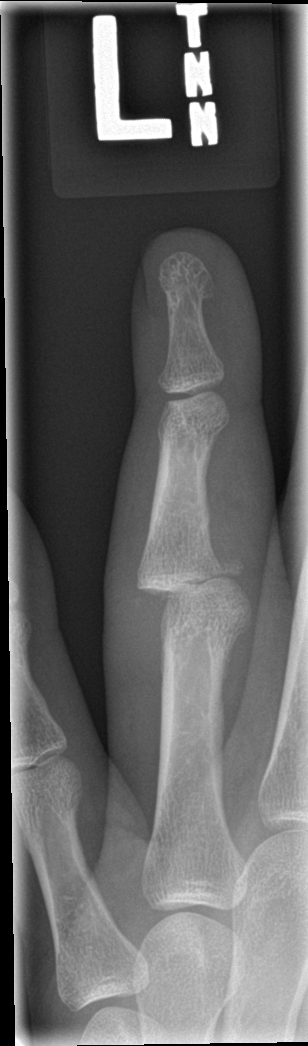

[finger lat]
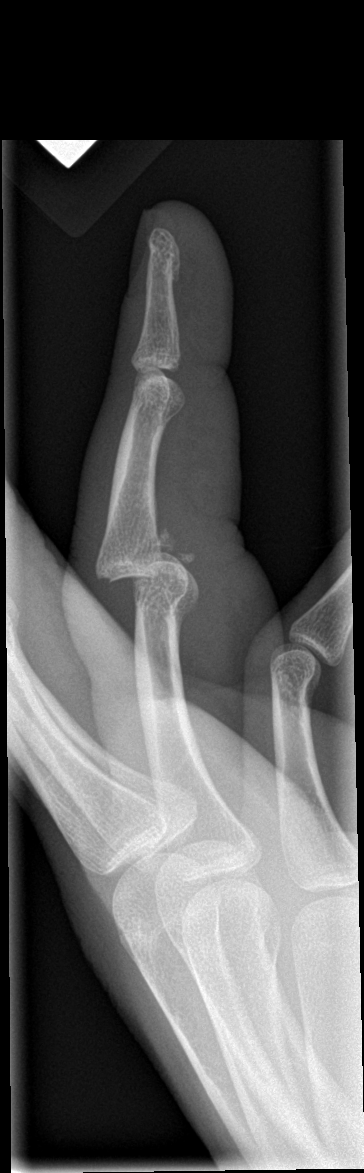

[3 of 3 positions shown; findings below may reference images not displayed]

FINDINGS: Acute fracture dislocation of the proximal interphalangeal joint of
the left fourth digit. There is nearly 1 full width dorsal shaft
displacement at PIP with associated swelling. There are 2 small
fracture fragments at the volar aspect of the joint.

The fracture sites appear to involve both the volar and dorsal
aspect of the base of the middle phalanx.
IMPRESSION: Acute fracture dislocation of the left fourth digit PIP, with dorsal
displacement of the middle phalanx and 2 small fracture fragments
from the base of the middle phalanx, as above. Associated soft
tissue swelling.

## 2018-10-07 ENCOUNTER — Encounter (HOSPITAL_COMMUNITY): Payer: Self-pay

## 2018-10-07 ENCOUNTER — Emergency Department (HOSPITAL_COMMUNITY)
Admission: EM | Admit: 2018-10-07 | Discharge: 2018-10-07 | Disposition: A | Discharge status: Home / Self Care | Payer: Medicare Other | Attending: Emergency Medicine | Admitting: Emergency Medicine

## 2018-10-07 ENCOUNTER — Other Ambulatory Visit: Payer: Self-pay

## 2018-10-07 DIAGNOSIS — J020 Streptococcal pharyngitis: Secondary | ICD-10-CM | POA: Diagnosis not present

## 2018-10-07 DIAGNOSIS — G809 Cerebral palsy, unspecified: Secondary | ICD-10-CM | POA: Diagnosis not present

## 2018-10-07 DIAGNOSIS — J029 Acute pharyngitis, unspecified: Secondary | ICD-10-CM | POA: Diagnosis present

## 2018-10-07 LAB — GROUP A STREP BY PCR: Group A Strep by PCR: DETECTED — AB

## 2018-10-07 MED ORDER — MAGIC MOUTHWASH W/LIDOCAINE: 5.0000 mL | Freq: Three times a day (TID) | ORAL | 0 refills | Status: DC | PRN

## 2018-10-07 MED ORDER — PENICILLIN V POTASSIUM 500 MG PO TABS: 500.0000 mg | ORAL_TABLET | Freq: Three times a day (TID) | ORAL | 0 refills | Status: DC

## 2018-10-07 NOTE — Discharge Instructions (Addendum)
Drink plenty of fluids.  Take the antibiotic as directed until its finished.  Follow-up with your doctor for recheck if needed

## 2018-10-07 NOTE — ED Notes (Signed)
Instructed pt to take all of antibiotics as prescribed. 

## 2018-10-07 NOTE — ED Triage Notes (Signed)
Pt reports sore throat that started yesterday. Pt reports that he has been around children with strep
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
## 2021-03-30 ENCOUNTER — Telehealth (INDEPENDENT_AMBULATORY_CARE_PROVIDER_SITE_OTHER): Payer: Self-pay

## 2021-03-30 NOTE — Telephone Encounter (Signed)
Called patient and scheduled him for new patient appointment on 05/18/2021 at 11:20am. Patient verbalized an understanding.

## 2021-03-30 NOTE — Telephone Encounter (Signed)
Patient called and stated that his PCP Dr. Janna Arch is retiring and he is looking for a new PCP.  Please advise if we are able to approve him for a new patient.

## 2021-05-18 ENCOUNTER — Ambulatory Visit (INDEPENDENT_AMBULATORY_CARE_PROVIDER_SITE_OTHER): Payer: Medicare Other | Admitting: Internal Medicine

## 2021-07-20 ENCOUNTER — Encounter: Payer: Self-pay | Admitting: Internal Medicine

## 2021-07-20 ENCOUNTER — Ambulatory Visit (INDEPENDENT_AMBULATORY_CARE_PROVIDER_SITE_OTHER): Payer: Medicare Other | Admitting: Internal Medicine

## 2021-07-20 ENCOUNTER — Encounter (INDEPENDENT_AMBULATORY_CARE_PROVIDER_SITE_OTHER): Payer: Self-pay

## 2021-07-20 ENCOUNTER — Other Ambulatory Visit: Payer: Self-pay

## 2021-07-20 VITALS — BP 136/90 | HR 78 | Temp 98.4°F | Resp 18 | Ht 70.0 in | Wt 255.1 lb

## 2021-07-20 DIAGNOSIS — Z2821 Immunization not carried out because of patient refusal: Secondary | ICD-10-CM

## 2021-07-20 DIAGNOSIS — Z7689 Persons encountering health services in other specified circumstances: Secondary | ICD-10-CM | POA: Diagnosis not present

## 2021-07-20 DIAGNOSIS — Z114 Encounter for screening for human immunodeficiency virus [HIV]: Secondary | ICD-10-CM

## 2021-07-20 DIAGNOSIS — D72819 Decreased white blood cell count, unspecified: Secondary | ICD-10-CM

## 2021-07-20 DIAGNOSIS — I10 Essential (primary) hypertension: Secondary | ICD-10-CM | POA: Diagnosis not present

## 2021-07-20 DIAGNOSIS — G809 Cerebral palsy, unspecified: Secondary | ICD-10-CM

## 2021-07-20 DIAGNOSIS — E782 Mixed hyperlipidemia: Secondary | ICD-10-CM | POA: Insufficient documentation

## 2021-07-20 DIAGNOSIS — Z131 Encounter for screening for diabetes mellitus: Secondary | ICD-10-CM

## 2021-07-20 DIAGNOSIS — Z1159 Encounter for screening for other viral diseases: Secondary | ICD-10-CM

## 2021-07-20 NOTE — Patient Instructions (Addendum)
Please follow DASH diet and perform moderate exercise/walking at least 150 mins/week.  Please take Vitamin D 2000 IU once daily instead of 5000 IU.  Please continue to take medications as prescribed.

## 2021-07-21 LAB — CBC
Hematocrit: 47.7 % (ref 37.5–51.0)
Hemoglobin: 15.8 g/dL (ref 13.0–17.7)
MCH: 25.7 pg — ABNORMAL LOW (ref 26.6–33.0)
MCHC: 33.1 g/dL (ref 31.5–35.7)
MCV: 78 fL — ABNORMAL LOW (ref 79–97)
Platelets: 169 10*3/uL (ref 150–450)
RBC: 6.14 x10E6/uL — ABNORMAL HIGH (ref 4.14–5.80)
RDW: 12.9 % (ref 11.6–15.4)
WBC: 2.2 10*3/uL — CL (ref 3.4–10.8)

## 2021-07-21 LAB — HIV ANTIBODY (ROUTINE TESTING W REFLEX): HIV Screen 4th Generation wRfx: NONREACTIVE

## 2021-07-21 LAB — LIPID PANEL
Chol/HDL Ratio: 2.1 ratio (ref 0.0–5.0)
Cholesterol, Total: 157 mg/dL (ref 100–199)
HDL: 74 mg/dL (ref >39)
LDL Chol Calc (NIH): 75 mg/dL (ref 0–99)
Triglycerides: 33 mg/dL (ref 0–149)
VLDL Cholesterol Cal: 8 mg/dL (ref 5–40)

## 2021-07-21 LAB — CMP14+EGFR
ALT: 35 IU/L (ref 0–44)
AST: 42 IU/L — ABNORMAL HIGH (ref 0–40)
Albumin/Globulin Ratio: 1.6 (ref 1.2–2.2)
Albumin: 4.6 g/dL (ref 4.0–5.0)
Alkaline Phosphatase: 64 IU/L (ref 44–121)
BUN/Creatinine Ratio: 8 — ABNORMAL LOW (ref 9–20)
BUN: 9 mg/dL (ref 6–24)
Bilirubin Total: 1 mg/dL (ref 0.0–1.2)
CO2: 25 mmol/L (ref 20–29)
Calcium: 9.7 mg/dL (ref 8.7–10.2)
Chloride: 99 mmol/L (ref 96–106)
Creatinine, Ser: 1.09 mg/dL (ref 0.76–1.27)
Globulin, Total: 2.9 g/dL (ref 1.5–4.5)
Glucose: 88 mg/dL (ref 70–99)
Potassium: 4.6 mmol/L (ref 3.5–5.2)
Sodium: 138 mmol/L (ref 134–144)
Total Protein: 7.5 g/dL (ref 6.0–8.5)
eGFR: 87 mL/min/{1.73_m2} (ref >59)

## 2021-07-21 LAB — HEPATITIS C ANTIBODY: Hep C Virus Ab: 0.1 s/co ratio (ref 0.0–0.9)

## 2021-07-22 DIAGNOSIS — G809 Cerebral palsy, unspecified: Secondary | ICD-10-CM | POA: Insufficient documentation

## 2021-07-22 DIAGNOSIS — D72819 Decreased white blood cell count, unspecified: Secondary | ICD-10-CM | POA: Insufficient documentation

## 2021-07-22 NOTE — Assessment & Plan Note (Signed)
On Crestor 

## 2021-07-22 NOTE — Assessment & Plan Note (Addendum)
CBC reviewed today - showed leukopenia Will recheck CBC after 1 month

## 2021-07-22 NOTE — Assessment & Plan Note (Signed)
BP Readings from Last 1 Encounters:  07/20/21 136/90   New-onset Recheck in the next visit Advised DASH diet and moderate exercise/walking, at least 150 mins/week

## 2021-07-22 NOTE — Assessment & Plan Note (Signed)
History of cerebral palsy, has learning disability Able to ambulate without support Works as a Copy currently

## 2021-07-22 NOTE — Assessment & Plan Note (Signed)
Care established Previous chart reviewed History and medications reviewed with the patient 

## 2021-07-22 NOTE — Progress Notes (Signed)
New Patient Office Visit  Subjective:  Patient ID: Nicholas Dunlap, male    DOB: April 06, 1981  Age: 40 y.o. MRN: [id number]  CC:  Chief Complaint  Patient presents with   New Patient (Initial Visit)    New patient was seeing dr Cindie Laroche just establishing care     HPI Nicholas Dunlap is a 40 year old male with PMH of cerebral palsy and HLD who presents for establishing care.  He has a history of cerebral palsy and learning disability.  He walks without support currently.  He works as a Retail buyer currently.  He takes Crestor for HLD.  He has brought blood test reports from old records.  His BP was elevated upon multiple measurements.  He denies any headache, dizziness, chest pain, dyspnea or palpitations.  His old records from previous PCP also show intermittently elevated BP.  He refused flu vaccine in the office today.    Past Medical History:  Diagnosis Date   Cerebral palsy ([place])    CLOSED FRACTURE UNSPEC PHALANX/PHALANGES HAND 08/01/2007   Qualifier: Diagnosis of  By: Aline Brochure MD, [place]     Eczema    chest, buttock   Finger fracture, left 09/07/2016   fx./dislocation ring finger   Hyperlipidemia    Limp     Past Surgical History:  Procedure Laterality Date   FINGER ARTHROPLASTY Left 09/13/2016   Procedure: LEFT RING FINGER VOLAR-PLATE REPAIR;  Surgeon: Milly Jakob, MD;  Location: [facility];  Service: Orthopedics;  Laterality: Left;   FOOT CAPSULE RELEASE W/ PERCUTANEOUS HEEL CORD LENGTHENING, TIBIAL TENDON TRANSFER Left    as a child    History reviewed. No pertinent family history.  Social History   Socioeconomic History   Marital status: Single    Spouse name: Not on file   Number of children: Not on file   Years of education: Not on file   Highest education level: Not on file  Occupational History   Not on file  Tobacco Use   Smoking status: Never   Smokeless tobacco: Never  Substance and Sexual Activity   Alcohol use: No    Drug use: No   Sexual activity: Not on file  Other Topics Concern   Not on file  Social History Narrative   Not on file   Social Determinants of Health   Financial Resource Strain: Not on file  Food Insecurity: Not on file  Transportation Needs: Not on file  Physical Activity: Not on file  Stress: Not on file  Social Connections: Not on file  Intimate Partner Violence: Not on file    ROS Review of Systems  Constitutional:  Negative for chills and fever.  HENT:  Negative for congestion and sore throat.   Eyes:  Negative for pain and discharge.  Respiratory:  Negative for cough and shortness of breath.   Cardiovascular:  Negative for chest pain and palpitations.  Gastrointestinal:  Negative for constipation, diarrhea, nausea and vomiting.  Endocrine: Negative for polydipsia and polyuria.  Genitourinary:  Negative for dysuria and hematuria.  Musculoskeletal:  Negative for neck pain and neck stiffness.  Skin:  Negative for rash.  Neurological:  Negative for dizziness, weakness, numbness and headaches.  Psychiatric/Behavioral:  Negative for agitation and behavioral problems.    Objective:   Today's Vitals: BP 136/90 (BP Location: Left Arm, Cuff Size: Normal)   Pulse 78   Temp 98.4 F (36.9 C) (Oral)   Resp 18   Ht _0  (1.778 m)   Wt  255 lb 1.3 oz (115.7 kg)   SpO2 97%   BMI 36.60 kg/m   Physical Exam Vitals reviewed.  Constitutional:      General: He is not in acute distress.    Appearance: He is not diaphoretic.  HENT:     Head: Normocephalic and atraumatic.     Nose: Nose normal.     Mouth/Throat:     Mouth: Mucous membranes are moist.  Eyes:     General: No scleral icterus.    Extraocular Movements: Extraocular movements intact.     Comments: B/l strabismus  Cardiovascular:     Rate and Rhythm: Normal rate and regular rhythm.     Pulses: Normal pulses.     Heart sounds: Normal heart sounds. No murmur heard. Pulmonary:     Breath sounds: Normal breath  sounds. No wheezing or rales.  Abdominal:     Tenderness: There is no abdominal tenderness.  Musculoskeletal:     Cervical back: Neck supple. No tenderness.     Right lower leg: No edema.     Left lower leg: No edema.  Skin:    General: Skin is warm.     Findings: No rash.  Neurological:     General: No focal deficit present.     Mental Status: He is alert and oriented to person, place, and time.  Psychiatric:        Mood and Affect: Mood normal.        Behavior: Behavior normal.    Assessment & Plan:   Problem List Items Addressed This Visit       Cardiovascular and Mediastinum   Essential hypertension - Primary    BP Readings from Last 1 Encounters:  07/20/21 136/90  New-onset Recheck in the next visit Advised DASH diet and moderate exercise/walking, at least 150 mins/week       Relevant Orders   CBC (Completed)   CMP14+EGFR (Completed)     Nervous and Auditory   Cerebral palsy (HCC)    History of cerebral palsy, has learning disability Able to ambulate without support Works as a Retail buyer currently        Other   Encounter to establish care    Care established Previous chart reviewed History and medications reviewed with the patient      Relevant Orders   CBC (Completed)   Mixed hyperlipidemia    On Crestor      Relevant Orders   Lipid panel (Completed)   Leukopenia    CBC reviewed today - showed leukopenia Will recheck CBC after 1 month      Relevant Orders   CBC with Differential/Platelet   Other Visit Diagnoses     Need for hepatitis C screening test       Relevant Orders   Hepatitis C Antibody (Completed)   Encounter for screening for HIV       Relevant Orders   HIV antibody (with reflex) (Completed)   Screening for diabetes mellitus (DM)       Refused influenza vaccine           Outpatient Encounter Medications as of 07/20/2021  Medication Sig   Cholecalciferol (D3 5000 PO) Take by mouth. Take 1daily   rosuvastatin (CRESTOR)  20 MG tablet Take 20 mg by mouth daily.   [DISCONTINUED] magic mouthwash w/lidocaine SOLN Take 5 mLs by mouth 3 (three) times daily as needed for mouth pain. Swish and spit, do not swallow   [DISCONTINUED] penicillin v potassium (VEETID) 500 MG  tablet Take 1 tablet (500 mg total) by mouth 3 (three) times daily. For 10 days   No facility-administered encounter medications on file as of 07/20/2021.    Follow-up: Return in about 3 months (around 10/20/2021) for HTN.   Lindell Spar, MD

## 2021-10-06 ENCOUNTER — Telehealth: Payer: Self-pay | Admitting: Internal Medicine

## 2021-10-06 NOTE — Telephone Encounter (Signed)
Calling to schedule AWV please schedule for pt

## 2021-10-06 NOTE — Telephone Encounter (Signed)
Pt is returning your call

## 2021-10-07 NOTE — Telephone Encounter (Signed)
AWV scheduled 

## 2021-10-20 ENCOUNTER — Other Ambulatory Visit: Payer: Self-pay

## 2021-10-20 ENCOUNTER — Encounter: Payer: Self-pay | Admitting: Internal Medicine

## 2021-10-20 ENCOUNTER — Ambulatory Visit (INDEPENDENT_AMBULATORY_CARE_PROVIDER_SITE_OTHER): Payer: Medicare Other | Admitting: Internal Medicine

## 2021-10-20 ENCOUNTER — Encounter (INDEPENDENT_AMBULATORY_CARE_PROVIDER_SITE_OTHER): Payer: Self-pay

## 2021-10-20 VITALS — BP 140/94 | HR 78 | Ht 70.0 in | Wt 264.2 lb

## 2021-10-20 DIAGNOSIS — D72819 Decreased white blood cell count, unspecified: Secondary | ICD-10-CM

## 2021-10-20 DIAGNOSIS — E782 Mixed hyperlipidemia: Secondary | ICD-10-CM | POA: Diagnosis not present

## 2021-10-20 DIAGNOSIS — I10 Essential (primary) hypertension: Secondary | ICD-10-CM | POA: Diagnosis not present

## 2021-10-20 DIAGNOSIS — Z2821 Immunization not carried out because of patient refusal: Secondary | ICD-10-CM | POA: Diagnosis not present

## 2021-10-20 MED ORDER — TELMISARTAN 20 MG PO TABS: 20.0000 mg | ORAL_TABLET | Freq: Every day | ORAL | 2 refills | Status: DC

## 2021-10-20 MED ORDER — ROSUVASTATIN CALCIUM 20 MG PO TABS: 20.0000 mg | ORAL_TABLET | Freq: Every day | ORAL | 1 refills | Status: DC

## 2021-10-20 NOTE — Patient Instructions (Signed)
Please start taking Telmisartan as prescribed.  Please follow DASH diet and perform moderate exercise/walking at least 150 mins/week.  Please get fasting blood tests done after 2 weeks. 

## 2021-10-22 NOTE — Assessment & Plan Note (Signed)
On Crestor Lipid profile reviewed 

## 2021-10-22 NOTE — Assessment & Plan Note (Signed)
CBC reviewed today - showed leukopenia Will recheck CBC

## 2021-10-22 NOTE — Assessment & Plan Note (Signed)
BP Readings from Last 1 Encounters:  10/20/21 (!) 140/94   New-onset Started telmisartan 20 mg daily Advised DASH diet and moderate exercise/walking, at least 150 mins/week

## 2021-10-22 NOTE — Progress Notes (Signed)
Established Patient Office Visit  Subjective:  Patient ID: Nicholas Dunlap, male    DOB: 05/03/1981  Age: 41 y.o. MRN: [id number]  CC:  Chief Complaint  Patient presents with   Follow-up    3 month f/u htn    HPI Nicholas Dunlap is a 41 y.o. male with past medical history of cerebral palsy and HLD who presents for f/u of his chronic medical conditions.  His BP was elevated upon multiple measurements.  He denies any headache, dizziness, chest pain, dyspnea or palpitations.  His old records from previous PCP also show intermittently elevated BP.  Blood tests were reviewed and discussed with the patient in detail.  Past Medical History:  Diagnosis Date   Cerebral palsy ([place])    CLOSED FRACTURE UNSPEC PHALANX/PHALANGES HAND 08/01/2007   Qualifier: Diagnosis of  By: Aline Brochure MD, [place]     Eczema    chest, buttock   Finger fracture, left 09/07/2016   fx./dislocation ring finger   Hyperlipidemia    Limp     Past Surgical History:  Procedure Laterality Date   FINGER ARTHROPLASTY Left 09/13/2016   Procedure: LEFT RING FINGER VOLAR-PLATE REPAIR;  Surgeon: Milly Jakob, MD;  Location: [facility];  Service: Orthopedics;  Laterality: Left;   FOOT CAPSULE RELEASE W/ PERCUTANEOUS HEEL CORD LENGTHENING, TIBIAL TENDON TRANSFER Left    as a child    History reviewed. No pertinent family history.  Social History   Socioeconomic History   Marital status: Single    Spouse name: Not on file   Number of children: Not on file   Years of education: Not on file   Highest education level: Not on file  Occupational History   Not on file  Tobacco Use   Smoking status: Never   Smokeless tobacco: Never  Substance and Sexual Activity   Alcohol use: No   Drug use: No   Sexual activity: Not on file  Other Topics Concern   Not on file  Social History Narrative   Not on file   Social Determinants of Health   Financial Resource Strain: Not on file  Food  Insecurity: Not on file  Transportation Needs: Not on file  Physical Activity: Not on file  Stress: Not on file  Social Connections: Not on file  Intimate Partner Violence: Not on file    Outpatient Medications Prior to Visit  Medication Sig Dispense Refill   Cholecalciferol (D3 5000 PO) Take by mouth. Take 1daily     rosuvastatin (CRESTOR) 20 MG tablet Take 20 mg by mouth daily.     No facility-administered medications prior to visit.    Allergies  Allergen Reactions   Milk-Related Compounds Shortness Of Breath    ROS Review of Systems  Constitutional:  Negative for chills and fever.  HENT:  Negative for congestion and sore throat.   Eyes:  Negative for pain and discharge.  Respiratory:  Negative for cough and shortness of breath.   Cardiovascular:  Negative for chest pain and palpitations.  Gastrointestinal:  Negative for constipation, diarrhea, nausea and vomiting.  Endocrine: Negative for polydipsia and polyuria.  Genitourinary:  Negative for dysuria and hematuria.  Musculoskeletal:  Negative for neck pain and neck stiffness.  Skin:  Negative for rash.  Neurological:  Negative for dizziness, weakness, numbness and headaches.  Psychiatric/Behavioral:  Negative for agitation and behavioral problems.      Objective:    Physical Exam Vitals reviewed.  Constitutional:      General:  He is not in acute distress.    Appearance: He is not diaphoretic.  HENT:     Head: Normocephalic and atraumatic.     Nose: Nose normal.     Mouth/Throat:     Mouth: Mucous membranes are moist.  Eyes:     General: No scleral icterus.    Extraocular Movements: Extraocular movements intact.     Comments: B/l strabismus  Cardiovascular:     Rate and Rhythm: Normal rate and regular rhythm.     Pulses: Normal pulses.     Heart sounds: Normal heart sounds. No murmur heard. Pulmonary:     Breath sounds: Normal breath sounds. No wheezing or rales.  Musculoskeletal:     Cervical back: Neck  supple. No tenderness.     Right lower leg: No edema.     Left lower leg: No edema.  Skin:    General: Skin is warm.     Findings: No rash.  Neurological:     General: No focal deficit present.     Mental Status: He is alert and oriented to person, place, and time.     Sensory: No sensory deficit.     Motor: No weakness.  Psychiatric:        Mood and Affect: Mood normal.        Behavior: Behavior normal.    BP (!) 140/94 (BP Location: Left Arm, Cuff Size: Normal)    Pulse 78    Ht 5' 10"  (1.778 m)    Wt 264 lb 3.2 oz (119.8 kg)    SpO2 97%    BMI 37.91 kg/m  Wt Readings from Last 3 Encounters:  10/20/21 264 lb 3.2 oz (119.8 kg)  07/20/21 255 lb 1.3 oz (115.7 kg)  10/07/18 240 lb (108.9 kg)    No results found for: TSH Lab Results  Component Value Date   WBC 2.2 (LL) 07/20/2021   HGB 15.8 07/20/2021   HCT 47.7 07/20/2021   MCV 78 (L) 07/20/2021   PLT 169 07/20/2021   Lab Results  Component Value Date   NA 138 07/20/2021   K 4.6 07/20/2021   CO2 25 07/20/2021   GLUCOSE 88 07/20/2021   BUN 9 07/20/2021   CREATININE 1.09 07/20/2021   BILITOT 1.0 07/20/2021   ALKPHOS 64 07/20/2021   AST 42 (H) 07/20/2021   ALT 35 07/20/2021   PROT 7.5 07/20/2021   ALBUMIN 4.6 07/20/2021   CALCIUM 9.7 07/20/2021   ANIONGAP 5 05/18/2016   EGFR 87 07/20/2021   Lab Results  Component Value Date   CHOL 157 07/20/2021   Lab Results  Component Value Date   HDL 74 07/20/2021   Lab Results  Component Value Date   LDLCALC 75 07/20/2021   Lab Results  Component Value Date   TRIG 33 07/20/2021   Lab Results  Component Value Date   CHOLHDL 2.1 07/20/2021   No results found for: HGBA1C    Assessment & Plan:   Problem List Items Addressed This Visit       Cardiovascular and Mediastinum   Essential hypertension - Primary    BP Readings from Last 1 Encounters:  10/20/21 (!) 140/94  New-onset Started telmisartan 20 mg daily Advised DASH diet and moderate exercise/walking,  at least 150 mins/week      Relevant Medications   telmisartan (MICARDIS) 20 MG tablet   rosuvastatin (CRESTOR) 20 MG tablet   Other Relevant Orders   Basic Metabolic Panel (BMET)     Other  Mixed hyperlipidemia    On Crestor Lipid profile reviewed      Relevant Medications   telmisartan (MICARDIS) 20 MG tablet   rosuvastatin (CRESTOR) 20 MG tablet   Leukopenia    CBC reviewed today - showed leukopenia Will recheck CBC      Relevant Orders   CBC with Differential/Platelet   Other Visit Diagnoses     Refused influenza vaccine           Meds ordered this encounter  Medications   telmisartan (MICARDIS) 20 MG tablet    Sig: Take 1 tablet (20 mg total) by mouth daily.    Dispense:  30 tablet    Refill:  2   rosuvastatin (CRESTOR) 20 MG tablet    Sig: Take 1 tablet (20 mg total) by mouth daily.    Dispense:  90 tablet    Refill:  1    Follow-up: Return in about 3 months (around 01/18/2022) for HTN.    Lindell Spar, MD

## 2021-10-26 ENCOUNTER — Ambulatory Visit: Payer: Medicare Other

## 2021-10-26 ENCOUNTER — Other Ambulatory Visit: Payer: Self-pay

## 2021-10-28 ENCOUNTER — Ambulatory Visit (INDEPENDENT_AMBULATORY_CARE_PROVIDER_SITE_OTHER): Payer: Medicare Other

## 2021-10-28 ENCOUNTER — Other Ambulatory Visit: Payer: Self-pay

## 2021-10-28 DIAGNOSIS — Z0000 Encounter for general adult medical examination without abnormal findings: Secondary | ICD-10-CM | POA: Diagnosis not present

## 2021-10-28 NOTE — Progress Notes (Signed)
Subjective:   Nicholas Dunlap Haro is a 41 y.o. male who presents for Medicare Annual/Subsequent preventive examination. I connected with  Nicholas Dunlap Weidinger on 10/28/21 by a audio enabled telemedicine application and verified that I am speaking with the correct person using two identifiers.  Patient Location: Home Provider Location: Office/Clinic   I discussed the limitations of evaluation and management by telemedicine. The patient expressed understanding and agreed to proceed.  Review of Systems    Defer to PCP Cardiac Risk Factors include: male gender     Objective:    Today's Vitals   10/28/21 1623  PainSc: 0-No pain   There is no height or weight on file to calculate BMI.  Advanced Directives 10/28/2021 10/07/2018 09/17/2017 09/13/2016 09/12/2016 09/07/2016 05/18/2016  Does Patient Have a Medical Advance Directive? No No No No No No No  Would patient like information on creating a medical advance directive? No - Patient declined - No - Patient declined - No - Patient declined - No - patient declined information    Current Medications (verified) Outpatient Encounter Medications as of 10/28/2021  Medication Sig   Cholecalciferol (D3 5000 PO) Take by mouth. Take 1daily   rosuvastatin (CRESTOR) 20 MG tablet Take 1 tablet (20 mg total) by mouth daily.   telmisartan (MICARDIS) 20 MG tablet Take 1 tablet (20 mg total) by mouth daily.   No facility-administered encounter medications on file as of 10/28/2021.    Allergies (verified) Milk-related compounds   History: Past Medical History:  Diagnosis Date   Cerebral palsy (HCC)    CLOSED FRACTURE UNSPEC PHALANX/PHALANGES HAND 08/01/2007   Qualifier: Diagnosis of  By: Romeo AppleHarrison MD, Stanley     Eczema    chest, buttock   Finger fracture, left 09/07/2016   fx./dislocation ring finger   Hyperlipidemia    Limp    Past Surgical History:  Procedure Laterality Date   FINGER ARTHROPLASTY Left 09/13/2016   Procedure: LEFT RING FINGER  VOLAR-PLATE REPAIR;  Surgeon: Mack Hookavid Thompson, MD;  Location: [facility];  Service: Orthopedics;  Laterality: Left;   FOOT CAPSULE RELEASE W/ PERCUTANEOUS HEEL CORD LENGTHENING, TIBIAL TENDON TRANSFER Left    as a child   No family history on file. Social History   Socioeconomic History   Marital status: Single    Spouse name: Not on file   Number of children: 0   Years of education: 12   Highest education level: Not on file  Occupational History   Not on file  Tobacco Use   Smoking status: Never   Smokeless tobacco: Never  Vaping Use   Vaping Use: Never used  Substance and Sexual Activity   Alcohol use: No   Drug use: No   Sexual activity: Not Currently  Other Topics Concern   Not on file  Social History Narrative   Not on file   Social Determinants of Health   Financial Resource Strain: Low Risk    Difficulty of Paying Living Expenses: Not hard at all  Food Insecurity: No Food Insecurity   Worried About Programme researcher, broadcasting/film/videounning Out of Food in the Last Year: Never true   Ran Out of Food in the Last Year: Never true  Transportation Needs: No Transportation Needs   Lack of Transportation (Medical): No   Lack of Transportation (Non-Medical): No  Physical Activity: Sufficiently Active   Days of Exercise per Week: 3 days   Minutes of Exercise per Session: 60 min  Stress: No Stress Concern Present   Feeling  of Stress : Not at all  Social Connections: Moderately Isolated   Frequency of Communication with Friends and Family: Once a week   Frequency of Social Gatherings with Friends and Family: Once a week   Attends Religious Services: More than 4 times per year   Active Member of [employer] or Organizations: Yes   Attends Engineer, structural: More than 4 times per year   Marital Status: Never married    Tobacco Counseling Counseling given: Not Answered   Clinical Intake:  Pre-visit preparation completed: No  Pain : No/denies pain Pain Score: 0-No pain      Nutritional Risks: None Diabetes: No  How often do you need to have someone help you when you read instructions, pamphlets, or other written materials from your doctor or pharmacy?: 3 - Sometimes What is the last grade level you completed in school?: 12  Diabetic?no  Interpreter Needed?: No  Information entered by :: Cordelia Pen   Activities of Daily Living In your present state of health, do you have any difficulty performing the following activities: 10/28/2021  Hearing? N  Vision? N  Difficulty concentrating or making decisions? N  Walking or climbing stairs? N  Dressing or bathing? N  Doing errands, shopping? N  Preparing Food and eating ? N  Using the Toilet? N  In the past six months, have you accidently leaked urine? N  Do you have problems with loss of bowel control? N  Managing your Medications? N  Managing your Finances? N  Housekeeping or managing your Housekeeping? N  Some recent data might be hidden    Patient Care Team: Anabel Halon, MD as PCP - General (Internal Medicine)  Indicate any recent Medical Services you may have received from other than [facility] providers in the past year (date may be approximate).     Assessment:   This is a routine wellness examination for [place].  Hearing/Vision screen No results found.  Dietary issues and exercise activities discussed: Current Exercise Habits: Home exercise routine, Type of exercise: strength training/weights;Other - see comments (BAsketball), Time (Minutes): 60, Frequency (Times/Week): 3, Weekly Exercise (Minutes/Week): 180, Intensity: Mild   Goals Addressed   None   Depression Screen PHQ 2/9 Scores 10/28/2021 10/20/2021 07/20/2021  PHQ - 2 Score 0 0 0    Fall Risk Fall Risk  10/28/2021 10/20/2021 07/20/2021  Falls in the past year? 0 0 0  Number falls in past yr: 0 0 0  Injury with Fall? 0 0 0  Risk for fall due to : No Fall Risks No Fall Risks No Fall Risks  Follow up Falls evaluation completed Falls  evaluation completed Falls evaluation completed    FALL RISK PREVENTION PERTAINING TO THE HOME:  Any stairs in or around the home? Yes  If so, are there any without handrails? Yes  Home free of loose throw rugs in walkways, pet beds, electrical cords, etc? Yes  Adequate lighting in your home to reduce risk of falls? Yes   ASSISTIVE DEVICES UTILIZED TO PREVENT FALLS:  Life alert? No  Use of a cane, walker or w/Dunlap? No  Grab bars in the bathroom? No  Shower chair or bench in shower? No  Elevated toilet seat or a handicapped toilet? No    Cognitive Function:     6CIT Screen 10/28/2021  What Year? 0 points  What month? 0 points  What time? 0 points  Count back from 20 0 points  Months in reverse 0 points  Repeat phrase  0 points  Total Score 0    Immunizations Immunization History  Administered Date(s) Administered   Tdap 09/18/2017    TDAP status: Up to date  Flu Vaccine status: Due, Education has been provided regarding the importance of this vaccine. Advised may receive this vaccine at local pharmacy or Health Dept. Aware to provide a copy of the vaccination record if obtained from local pharmacy or Health Dept. Verbalized acceptance and understanding.  Pneumococcal vaccine status: Declined,  Education has been provided regarding the importance of this vaccine but patient still declined. Advised may receive this vaccine at local pharmacy or Health Dept. Aware to provide a copy of the vaccination record if obtained from local pharmacy or Health Dept. Verbalized acceptance and understanding.   Covid-19 vaccine status: Information provided on how to obtain vaccines.   Qualifies for Shingles Vaccine? Yes   Zostavax completed No   Shingrix Completed?: No.    Education has been provided regarding the importance of this vaccine. Patient has been advised to call insurance company to determine out of pocket expense if they have not yet received this vaccine. Advised may also receive  vaccine at local pharmacy or Health Dept. Verbalized acceptance and understanding.  Screening Tests Health Maintenance  Topic Date Due   INFLUENZA VACCINE  12/24/2021 (Originally 04/26/2021)   TETANUS/TDAP  09/19/2027   Hepatitis Dunlap Screening  Completed   HIV Screening  Completed   HPV VACCINES  Aged Out   COVID-19 Vaccine  Discontinued    Health Maintenance  There are no preventive care reminders to display for this patient.    Lung Cancer Screening: (Low Dose CT Chest recommended if Age 76-80 years, 30 pack-year currently smoking OR have quit w/in 15years.) does not qualify.   Lung Cancer Screening Referral: n/a  Additional Screening:  Hepatitis Dunlap Screening: does qualify; Completed 07/20/2021  Vision Screening: Recommended annual ophthalmology exams for early detection of glaucoma and other disorders of the eye. Is the patient up to date with their annual eye exam?  No  Who is the provider or what is the name of the office in which the patient attends annual eye exams? Pt not sure If pt is not established with a provider, would they like to be referred to a provider to establish care?  Pt says no need .   Dental Screening: Recommended annual dental exams for proper oral hygiene  Community Resource Referral / Chronic Care Management: CRR required this visit?  No   CCM required this visit?  No      Plan:     I have personally reviewed and noted the following in the patients chart:   Medical and social history Use of alcohol, tobacco or illicit drugs  Current medications and supplements including opioid prescriptions. Patient is not currently taking opioid prescriptions. Functional ability and status Nutritional status Physical activity Advanced directives List of other physicians Hospitalizations, surgeries, and ER visits in previous 12 months Vitals Screenings to include cognitive, depression, and falls Referrals and appointments  In addition, I have reviewed  and discussed with patient certain preventive protocols, quality metrics, and best practice recommendations. A written personalized care plan for preventive services as well as general preventive health recommendations were provided to patient.     Glendale ChardSherry D Chanice Brenton, CMA   10/28/2021   Nurse Notes:  Mr. Jenelle MagesHairston , Thank you for taking time to come for your Medicare Wellness Visit. I appreciate your ongoing commitment to your health goals. Please review the following plan  we discussed and let me know if I can assist you in the future.   These are the goals we discussed:  Goals   None     This is a list of the screening recommended for you and due dates:  Health Maintenance  Topic Date Due   Flu Shot  12/24/2021*   Tetanus Vaccine  09/19/2027   Hepatitis Dunlap Screening: USPSTF Recommendation to screen - Ages 18-79 yo.  Completed   HIV Screening  Completed   HPV Vaccine  Aged Out   COVID-19 Vaccine  Discontinued  *Topic was postponed. The date shown is not the original due date.

## 2021-10-28 NOTE — Patient Instructions (Signed)

## 2021-11-04 ENCOUNTER — Other Ambulatory Visit: Payer: Self-pay | Admitting: *Deleted

## 2021-11-04 DIAGNOSIS — D72819 Decreased white blood cell count, unspecified: Secondary | ICD-10-CM

## 2021-11-04 LAB — BASIC METABOLIC PANEL
BUN/Creatinine Ratio: 9 (ref 9–20)
BUN: 11 mg/dL (ref 6–24)
CO2: 24 mmol/L (ref 20–29)
Calcium: 9.8 mg/dL (ref 8.7–10.2)
Chloride: 101 mmol/L (ref 96–106)
Creatinine, Ser: 1.23 mg/dL (ref 0.76–1.27)
Glucose: 79 mg/dL (ref 70–99)
Potassium: 4.6 mmol/L (ref 3.5–5.2)
Sodium: 139 mmol/L (ref 134–144)
eGFR: 75 mL/min/{1.73_m2} (ref >59)

## 2021-11-04 LAB — CBC WITH DIFFERENTIAL/PLATELET
Basophils Absolute: 0 10*3/uL (ref 0.0–0.2)
Basos: 1 %
EOS (ABSOLUTE): 0 10*3/uL (ref 0.0–0.4)
Eos: 2 %
Hematocrit: 48.9 % (ref 37.5–51.0)
Hemoglobin: 15.9 g/dL (ref 13.0–17.7)
Immature Grans (Abs): 0 10*3/uL (ref 0.0–0.1)
Immature Granulocytes: 0 %
Lymphocytes Absolute: 1.1 10*3/uL (ref 0.7–3.1)
Lymphs: 45 %
MCH: 25.8 pg — ABNORMAL LOW (ref 26.6–33.0)
MCHC: 32.5 g/dL (ref 31.5–35.7)
MCV: 79 fL (ref 79–97)
Monocytes Absolute: 0.2 10*3/uL (ref 0.1–0.9)
Monocytes: 8 %
Neutrophils Absolute: 1.1 10*3/uL — ABNORMAL LOW (ref 1.4–7.0)
Neutrophils: 44 %
Platelets: 176 10*3/uL (ref 150–450)
RBC: 6.16 x10E6/uL — ABNORMAL HIGH (ref 4.14–5.80)
RDW: 13 % (ref 11.6–15.4)
WBC: 2.5 10*3/uL — CL (ref 3.4–10.8)

## 2021-11-22 ENCOUNTER — Inpatient Hospital Stay (HOSPITAL_COMMUNITY): Payer: Medicare Other

## 2021-11-22 ENCOUNTER — Other Ambulatory Visit (HOSPITAL_COMMUNITY): Payer: Self-pay | Admitting: *Deleted

## 2021-11-22 ENCOUNTER — Encounter (HOSPITAL_COMMUNITY): Payer: Self-pay | Admitting: Hematology

## 2021-11-22 ENCOUNTER — Inpatient Hospital Stay (HOSPITAL_COMMUNITY): Payer: Medicare Other | Attending: Hematology | Admitting: Hematology

## 2021-11-22 ENCOUNTER — Other Ambulatory Visit: Payer: Self-pay

## 2021-11-22 VITALS — BP 129/92 | HR 69 | Temp 96.7°F | Resp 18 | Ht 70.0 in | Wt 263.5 lb

## 2021-11-22 DIAGNOSIS — D72819 Decreased white blood cell count, unspecified: Secondary | ICD-10-CM

## 2021-11-22 DIAGNOSIS — R718 Other abnormality of red blood cells: Secondary | ICD-10-CM | POA: Insufficient documentation

## 2021-11-22 DIAGNOSIS — D709 Neutropenia, unspecified: Secondary | ICD-10-CM

## 2021-11-22 DIAGNOSIS — D649 Anemia, unspecified: Secondary | ICD-10-CM

## 2021-11-22 DIAGNOSIS — Z806 Family history of leukemia: Secondary | ICD-10-CM | POA: Diagnosis not present

## 2021-11-22 DIAGNOSIS — Z800 Family history of malignant neoplasm of digestive organs: Secondary | ICD-10-CM

## 2021-11-22 DIAGNOSIS — D509 Iron deficiency anemia, unspecified: Secondary | ICD-10-CM

## 2021-11-22 LAB — FERRITIN: Ferritin: 87 ng/mL (ref 24–336)

## 2021-11-22 LAB — CBC WITH DIFFERENTIAL/PLATELET
Abs Immature Granulocytes: 0 10*3/uL (ref 0.00–0.07)
Basophils Absolute: 0 10*3/uL (ref 0.0–0.1)
Basophils Relative: 1 %
Eosinophils Absolute: 0 10*3/uL (ref 0.0–0.5)
Eosinophils Relative: 2 %
HCT: 47.6 % (ref 39.0–52.0)
Hemoglobin: 15.4 g/dL (ref 13.0–17.0)
Immature Granulocytes: 0 %
Lymphocytes Relative: 46 %
Lymphs Abs: 1 10*3/uL (ref 0.7–4.0)
MCH: 26.8 pg (ref 26.0–34.0)
MCHC: 32.4 g/dL (ref 30.0–36.0)
MCV: 82.8 fL (ref 80.0–100.0)
Monocytes Absolute: 0.2 10*3/uL (ref 0.1–1.0)
Monocytes Relative: 7 %
Neutro Abs: 0.9 10*3/uL — ABNORMAL LOW (ref 1.7–7.7)
Neutrophils Relative %: 44 %
Platelets: 154 10*3/uL (ref 150–400)
RBC: 5.75 MIL/uL (ref 4.22–5.81)
RDW: 13.2 % (ref 11.5–15.5)
WBC: 2.1 10*3/uL — ABNORMAL LOW (ref 4.0–10.5)
nRBC: 0 % (ref 0.0–0.2)
nRBC: 0 /100 WBC

## 2021-11-22 LAB — IRON AND TIBC
Iron: 53 ug/dL (ref 45–182)
Saturation Ratios: 18 % (ref 17.9–39.5)
TIBC: 291 ug/dL (ref 250–450)
UIBC: 238 ug/dL

## 2021-11-22 LAB — HEPATITIS B SURFACE ANTIGEN: Hepatitis B Surface Ag: NONREACTIVE

## 2021-11-22 LAB — LACTATE DEHYDROGENASE: LDH: 157 U/L (ref 98–192)

## 2021-11-22 LAB — FOLATE: Folate: 13.6 ng/mL (ref >5.9)

## 2021-11-22 LAB — VITAMIN B12: Vitamin B-12: 245 pg/mL (ref 180–914)

## 2021-11-22 LAB — HEPATITIS B SURFACE ANTIBODY,QUALITATIVE: Hep B S Ab: NONREACTIVE

## 2021-11-22 LAB — SEDIMENTATION RATE: Sed Rate: 2 mm/hr (ref 0–16)

## 2021-11-22 LAB — C-REACTIVE PROTEIN: CRP: 0.6 mg/dL (ref <1.0)

## 2021-11-22 LAB — SAVE SMEAR(SSMR), FOR PROVIDER SLIDE REVIEW

## 2021-11-22 NOTE — Progress Notes (Signed)
[place] [address], [place] [address]   CLINIC:  Medical Oncology/Hematology  Patient Care Team: Lindell Spar, MD as PCP - General (Internal Medicine)  CHIEF COMPLAINTS/PURPOSE OF CONSULTATION:  Evaluation of leukopenia  HISTORY OF PRESENTING ILLNESS:  Nicholas Dunlap 41 y.o. male is here because of evaluation of leukopenia, at the request of Dr. Posey Pronto.  Today he reports feeling good, and he is accompanied by his mother. He denies recently starting any new medications, recent antibiotic use, and tick bites. He denies fevers, night sweats, skin rash, and weight loss. He denies history of blood transfusions.   He currently woks as a Retail buyer. He denies chemical exposure. He denies smoking history. He denies family history of anemia, lupus, and RA. Her paternal grandmother passed from leukemia, and his mother had colon cancer.   MEDICAL HISTORY:  Past Medical History:  Diagnosis Date   Cerebral palsy ([place])    CLOSED FRACTURE UNSPEC PHALANX/PHALANGES HAND 08/01/2007   Qualifier: Diagnosis of  By: Aline Brochure MD, Stanley     Eczema    chest, buttock   Finger fracture, left 09/07/2016   fx./dislocation ring finger   Hyperlipidemia    Limp     SURGICAL HISTORY: Past Surgical History:  Procedure Laterality Date   FINGER ARTHROPLASTY Left 09/13/2016   Procedure: LEFT RING FINGER VOLAR-PLATE REPAIR;  Surgeon: Milly Jakob, MD;  Location: [facility];  Service: Orthopedics;  Laterality: Left;   FOOT CAPSULE RELEASE W/ PERCUTANEOUS HEEL CORD LENGTHENING, TIBIAL TENDON TRANSFER Left    as a child    SOCIAL HISTORY: Social History   Socioeconomic History   Marital status: Single    Spouse name: Not on file   Number of children: 0   Years of education: 12   Highest education level: Not on file  Occupational History   Not on file  Tobacco Use   Smoking status: Never   Smokeless tobacco: Never  Vaping Use   Vaping Use: Never used   Substance and Sexual Activity   Alcohol use: No   Drug use: No   Sexual activity: Not Currently  Other Topics Concern   Not on file  Social History Narrative   Not on file   Social Determinants of Health   Financial Resource Strain: Low Risk    Difficulty of Paying Living Expenses: Not hard at all  Food Insecurity: No Food Insecurity   Worried About Charity fundraiser in the Last Year: Never true   [place] in the Last Year: Never true  Transportation Needs: No Transportation Needs   Lack of Transportation (Medical): No   Lack of Transportation (Non-Medical): No  Physical Activity: Sufficiently Active   Days of Exercise per Week: 3 days   Minutes of Exercise per Session: 60 min  Stress: No Stress Concern Present   Feeling of Stress : Not at all  Social Connections: Moderately Isolated   Frequency of Communication with Friends and Family: Once a week   Frequency of Social Gatherings with Friends and Family: Once a week   Attends Religious Services: More than 4 times per year   Active Member of [employer] or Organizations: Yes   Attends Music therapist: More than 4 times per year   Marital Status: Never married  Human resources officer Violence: Not At Risk   Fear of Current or Ex-Partner: No   Emotionally Abused: No   Physically Abused: No   Sexually Abused: No  FAMILY HISTORY: History reviewed. No pertinent family history.  ALLERGIES:  is allergic to milk-related compounds.  MEDICATIONS:  Current Outpatient Medications  Medication Sig Dispense Refill   Cholecalciferol (D3 5000 PO) Take by mouth. Take 1daily     rosuvastatin (CRESTOR) 20 MG tablet Take 1 tablet (20 mg total) by mouth daily. 90 tablet 1   telmisartan (MICARDIS) 20 MG tablet Take 1 tablet (20 mg total) by mouth daily. 30 tablet 2   No current facility-administered medications for this visit.    REVIEW OF SYSTEMS:   Review of Systems  Constitutional:  Negative for appetite change,  fatigue, fever and unexpected weight change.  Endocrine: Negative for hot flashes.  Skin:  Negative for rash.  All other systems reviewed and are negative.   PHYSICAL EXAMINATION: ECOG PERFORMANCE STATUS: 0 - Asymptomatic  Vitals:   11/22/21 0809  BP: (!) 129/92  Pulse: 69  Resp: 18  Temp: (!) 96.7 F (35.9 C)  SpO2: 100%   Filed Weights   11/22/21 0809  Weight: 263 lb 8 oz (119.5 kg)   Physical Exam Vitals reviewed.  Constitutional:      Appearance: Normal appearance. He is obese.  Cardiovascular:     Rate and Rhythm: Normal rate and regular rhythm.     Pulses: Normal pulses.     Heart sounds: Normal heart sounds.  Pulmonary:     Effort: Pulmonary effort is normal.     Breath sounds: Normal breath sounds.  Abdominal:     Palpations: There is no hepatomegaly, splenomegaly or mass.     Tenderness: There is no abdominal tenderness.  Musculoskeletal:     Right lower leg: No edema.     Left lower leg: No edema.  Lymphadenopathy:     Cervical: No cervical adenopathy.     Right cervical: No superficial cervical adenopathy.    Left cervical: No superficial cervical adenopathy.     Upper Body:     Right upper body: No supraclavicular or axillary adenopathy.     Left upper body: No supraclavicular or axillary adenopathy.     Lower Body: No right inguinal adenopathy. No left inguinal adenopathy.  Neurological:     General: No focal deficit present.     Mental Status: He is alert and oriented to person, place, and time.  Psychiatric:        Mood and Affect: Mood normal.        Behavior: Behavior normal.     LABORATORY DATA:  I have reviewed the data as listed Recent Results (from the past 2160 hour(s))  Basic Metabolic Panel (BMET)     Status: None   Collection Time: 11/03/21  8:36 AM  Result Value Ref Range   Glucose 79 70 - 99 mg/dL   BUN 11 6 - 24 mg/dL   Creatinine, Ser 1.23 0.76 - 1.27 mg/dL   eGFR 75 >59 mL/min/1.73   BUN/Creatinine Ratio 9 9 - 20    Sodium 139 134 - 144 mmol/L   Potassium 4.6 3.5 - 5.2 mmol/L   Chloride 101 96 - 106 mmol/L   CO2 24 20 - 29 mmol/L   Calcium 9.8 8.7 - 10.2 mg/dL  CBC with Differential/Platelet     Status: Abnormal   Collection Time: 11/03/21  8:36 AM  Result Value Ref Range   WBC 2.5 (LL) 3.4 - 10.8 x10E3/uL   RBC 6.16 (H) 4.14 - 5.80 x10E6/uL   Hemoglobin 15.9 13.0 - 17.7 g/dL   Hematocrit 48.9 37.5 -  51.0 %   MCV 79 79 - 97 fL   MCH 25.8 (L) 26.6 - 33.0 pg   MCHC 32.5 31.5 - 35.7 g/dL   RDW 13.0 11.6 - 15.4 %   Platelets 176 150 - 450 x10E3/uL   Neutrophils 44 Not Estab. %   Lymphs 45 Not Estab. %   Monocytes 8 Not Estab. %   Eos 2 Not Estab. %   Basos 1 Not Estab. %   Neutrophils Absolute 1.1 (L) 1.4 - 7.0 x10E3/uL   Lymphocytes Absolute 1.1 0.7 - 3.1 x10E3/uL   Monocytes Absolute 0.2 0.1 - 0.9 x10E3/uL   EOS (ABSOLUTE) 0.0 0.0 - 0.4 x10E3/uL   Basophils Absolute 0.0 0.0 - 0.2 x10E3/uL   Immature Granulocytes 0 Not Estab. %   Immature Grans (Abs) 0.0 0.0 - 0.1 x10E3/uL    RADIOGRAPHIC STUDIES: I have personally reviewed the radiological images as listed and agreed with the findings in the report. No results found.  ASSESSMENT:  Leukopenia and neutropenia: - Patient seen at the request of Dr. Posey Pronto for neutropenia and leukopenia. - CBC on 07/20/2021 with white count 2.2.  Normal platelet count and hemoglobin.  No differential. - CBCD on 11/03/2021 with white count 2.5, differential 44% neutrophils, 45% lymphocytes, 8% monocytes, 1% basophils and 2% eosinophils.  ANC was 1100. - White count was normal at 8.50 in August 2017 and February 2017.  It was low at 3.2 in 2013.  He has some - Denies any B symptoms.  Denies any new medications, OTC supplements.  No prior history of blood transfusion.   Social/family history: - He is seen along with his mother.  He works as a Retail buyer.  No chemical exposure.  Non-smoker. - No family history of leukopenia.  Paternal grandmother had leukemia.   Mother had colon cancer.   PLAN:  Leukopenia and neutropenia: - We have discussed various etiologies of leukopenia and neutropenia. - We will repeat CBC and check peripheral blood smear.  We will also check LDH and reticulocyte count.  Will evaluate for nutritional deficiencies including E33, folic acid, MMA, copper.  We will check for ESR, CRP, ANA, rheumatoid factor. - Previous testing in October 2022 was negative for HIV and hepatitis C.  We will evaluate hepatitis B serology. - He has slight intermittent microcytosis.  Will check ferritin and iron panel. - If the above work-up is nonrevealing, will consider flow cytometry with or without bone marrow biopsy. - RTC 2 to 3 weeks for follow-up   All questions were answered. The patient knows to call the clinic with any problems, questions or concerns.  Derek Jack, MD 11/22/21 8:49 AM  [facility] [phone]   I, Thana Ates, am acting as a scribe for Dr. Derek Jack.  I, Derek Jack MD, have reviewed the above documentation for accuracy and completeness, and I agree with the above.

## 2021-11-23 LAB — MISC LABCORP TEST (SEND OUT): Labcorp test code: 160101

## 2021-11-23 LAB — RHEUMATOID FACTOR: Rheumatoid fact SerPl-aCnc: <10 IU/mL (ref <14.0)

## 2021-11-23 LAB — COPPER, SERUM: Copper: 95 ug/dL (ref 69–132)

## 2021-11-24 LAB — PROTEIN ELECTROPHORESIS, SERUM
A/G Ratio: 1.5 (ref 0.7–1.7)
Albumin ELP: 4 g/dL (ref 2.9–4.4)
Alpha-1-Globulin: 0.2 g/dL (ref 0.0–0.4)
Alpha-2-Globulin: 0.6 g/dL (ref 0.4–1.0)
Beta Globulin: 0.7 g/dL (ref 0.7–1.3)
Gamma Globulin: 1.2 g/dL (ref 0.4–1.8)
Globulin, Total: 2.7 g/dL (ref 2.2–3.9)
Total Protein ELP: 6.7 g/dL (ref 6.0–8.5)

## 2021-11-25 LAB — METHYLMALONIC ACID, SERUM: Methylmalonic Acid, Quantitative: 137 nmol/L (ref 0–378)

## 2021-11-26 LAB — ANTINUCLEAR ANTIBODIES, IFA: ANA Ab, IFA: NEGATIVE

## 2021-12-22 ENCOUNTER — Inpatient Hospital Stay (HOSPITAL_COMMUNITY): Payer: Medicare Other | Attending: Hematology | Admitting: Hematology

## 2021-12-22 VITALS — BP 132/78 | HR 73 | Temp 98.2°F | Resp 18 | Ht 70.0 in | Wt 263.6 lb

## 2021-12-22 DIAGNOSIS — D509 Iron deficiency anemia, unspecified: Secondary | ICD-10-CM

## 2021-12-22 DIAGNOSIS — R0609 Other forms of dyspnea: Secondary | ICD-10-CM | POA: Diagnosis not present

## 2021-12-22 DIAGNOSIS — D709 Neutropenia, unspecified: Secondary | ICD-10-CM | POA: Diagnosis not present

## 2021-12-22 DIAGNOSIS — D72819 Decreased white blood cell count, unspecified: Secondary | ICD-10-CM

## 2021-12-22 NOTE — Progress Notes (Signed)
? ?[facility] ?[address] ?[place], [place] [address] ? ? ?CLINIC:  ?Medical Oncology/Hematology ? ?PCP:  ?Lindell Spar, MD ?[address] / [place] [address]  ?[phone] ? ?REASON FOR VISIT:  ?Follow-up for leukopenia ? ?PRIOR THERAPY: none ? ?CURRENT THERAPY: under work-up ? ?INTERVAL HISTORY:  ?[name], a 41 y.o. male, returns for routine follow-up for his leukopenia. Nicholas Dunlap was last seen on 11/22/2021. ? ?Today he reports feeling good. He denies history of serious or recurrent infections. He denies recurrent antibiotic use. He reports SOB with exertion and fatigue. He denies weight loss.  ? ?REVIEW OF SYSTEMS:  ?Review of Systems  ?Constitutional:  Positive for fatigue. Negative for appetite change and unexpected weight change.  ?Respiratory:  Positive for shortness of breath.   ?All other systems reviewed and are negative. ? ?PAST MEDICAL/SURGICAL HISTORY:  ?Past Medical History:  ?Diagnosis Date  ? Cerebral palsy ([place])   ? CLOSED FRACTURE UNSPEC PHALANX/PHALANGES HAND 08/01/2007  ? Qualifier: Diagnosis of  By: Aline Brochure MD, Dorothyann Peng    ? Eczema   ? chest, buttock  ? Finger fracture, left 09/07/2016  ? fx./dislocation ring finger  ? Hyperlipidemia   ? Limp   ? ?Past Surgical History:  ?Procedure Laterality Date  ? FINGER ARTHROPLASTY Left 09/13/2016  ? Procedure: LEFT RING FINGER VOLAR-PLATE REPAIR;  Surgeon: Milly Jakob, MD;  Location: [facility];  Service: Orthopedics;  Laterality: Left;  ? FOOT CAPSULE RELEASE W/ PERCUTANEOUS HEEL CORD LENGTHENING, TIBIAL TENDON TRANSFER Left   ? as a child  ? ? ?SOCIAL HISTORY:  ?Social History  ? ?Socioeconomic History  ? Marital status: Single  ?  Spouse name: Not on file  ? Number of children: 0  ? Years of education: 34  ? Highest education level: Not on file  ?Occupational History  ? Not on file  ?Tobacco Use  ? Smoking status: Never  ? Smokeless tobacco: Never  ?Vaping Use  ? Vaping Use: Never used  ?Substance  and Sexual Activity  ? Alcohol use: No  ? Drug use: No  ? Sexual activity: Not Currently  ?Other Topics Concern  ? Not on file  ?Social History Narrative  ? Not on file  ? ?Social Determinants of Health  ? ?Financial Resource Strain: Low Risk   ? Difficulty of Paying Living Expenses: Not hard at all  ?Food Insecurity: No Food Insecurity  ? Worried About Charity fundraiser in the Last Year: Never true  ? Ran Out of Food in the Last Year: Never true  ?Transportation Needs: No Transportation Needs  ? Lack of Transportation (Medical): No  ? Lack of Transportation (Non-Medical): No  ?Physical Activity: Sufficiently Active  ? Days of Exercise per Week: 3 days  ? Minutes of Exercise per Session: 60 min  ?Stress: No Stress Concern Present  ? Feeling of Stress : Not at all  ?Social Connections: Moderately Isolated  ? Frequency of Communication with Friends and Family: Once a week  ? Frequency of Social Gatherings with Friends and Family: Once a week  ? Attends Religious Services: More than 4 times per year  ? Active Member of Clubs or Organizations: Yes  ? Attends Archivist Meetings: More than 4 times per year  ? Marital Status: Never married  ?Intimate Partner Violence: Not At Risk  ? Fear of Current or Ex-Partner: No  ? Emotionally Abused: No  ? Physically Abused: No  ? Sexually Abused: No  ? ? ?  FAMILY HISTORY:  ?No family history on file. ? ?CURRENT MEDICATIONS:  ?Current Outpatient Medications  ?Medication Sig Dispense Refill  ? Cholecalciferol (D3 5000 PO) Take by mouth. Take 1daily    ? rosuvastatin (CRESTOR) 20 MG tablet Take 1 tablet (20 mg total) by mouth daily. 90 tablet 1  ? telmisartan (MICARDIS) 20 MG tablet Take 1 tablet (20 mg total) by mouth daily. 30 tablet 2  ? ?No current facility-administered medications for this visit.  ? ? ?ALLERGIES:  ?Allergies  ?Allergen Reactions  ? Milk-Related Compounds Shortness Of Breath  ? ? ?PHYSICAL EXAM:  ?Performance status (ECOG): 0 - Asymptomatic ? ?There were  no vitals filed for this visit. ?Wt Readings from Last 3 Encounters:  ?11/22/21 263 lb 8 oz (119.5 kg)  ?10/20/21 264 lb 3.2 oz (119.8 kg)  ?07/20/21 255 lb 1.3 oz (115.7 kg)  ? ?Physical Exam ?Vitals reviewed.  ?Constitutional:   ?   Appearance: Normal appearance. He is obese.  ?Cardiovascular:  ?   Rate and Rhythm: Normal rate and regular rhythm.  ?   Pulses: Normal pulses.  ?   Heart sounds: Normal heart sounds.  ?Pulmonary:  ?   Effort: Pulmonary effort is normal.  ?   Breath sounds: Normal breath sounds.  ?Neurological:  ?   General: No focal deficit present.  ?   Mental Status: He is alert and oriented to person, place, and time.  ?Psychiatric:     ?   Mood and Affect: Mood normal.     ?   Behavior: Behavior normal.  ? ? ?LABORATORY DATA:  ?I have reviewed the labs as listed.  ? ?  Latest Ref Rng & Units 11/22/2021  ?  8:54 AM 11/03/2021  ?  8:36 AM 07/20/2021  ?  9:47 AM  ?CBC  ?WBC 4.0 - 10.5 K/uL 2.1   2.5   2.2    ?Hemoglobin 13.0 - 17.0 g/dL 15.4   15.9   15.8    ?Hematocrit 39.0 - 52.0 % 47.6   48.9   47.7    ?Platelets 150 - 400 K/uL 154   176   169    ? ? ?  Latest Ref Rng & Units 11/03/2021  ?  8:36 AM 07/20/2021  ?  9:47 AM 05/18/2016  ?  9:44 AM  ?CMP  ?Glucose 70 - 99 mg/dL 79   88   101    ?BUN 6 - 24 mg/dL _0 ?Creatinine 0.76 - 1.27 mg/dL 1.23   1.09   1.12    ?Sodium 134 - 144 mmol/L 139   138   133    ?Potassium 3.5 - 5.2 mmol/L 4.6   4.6   4.4    ?Chloride 96 - 106 mmol/L 101   99   98    ?CO2 20 - 29 mmol/L _1 ?Calcium 8.7 - 10.2 mg/dL 9.8   9.7   9.3    ?Total Protein 6.0 - 8.5 g/dL  7.5   7.5    ?Total Bilirubin 0.0 - 1.2 mg/dL  1.0   1.7    ?Alkaline Phos 44 - 121 IU/L  64   46    ?AST 0 - 40 IU/L  42   20    ?ALT 0 - 44 IU/L  35   20    ? ?   ?Component Value Date/Time  ? RBC 5.75  11/22/2021 0854  ? MCV 82.8 11/22/2021 0854  ? MCV 79 11/03/2021 0836  ? MCH 26.8 11/22/2021 0854  ? MCHC 32.4 11/22/2021 0854  ? RDW 13.2 11/22/2021 0854  ? RDW 13.0 11/03/2021 0836  ?  LYMPHSABS 1.0 11/22/2021 0854  ? LYMPHSABS 1.1 11/03/2021 0836  ? MONOABS 0.2 11/22/2021 0854  ? EOSABS 0.0 11/22/2021 0854  ? EOSABS 0.0 11/03/2021 0836  ? BASOSABS 0.0 11/22/2021 0854  ? BASOSABS 0.0 11/03/2021 0836  ? ? ?DIAGNOSTIC IMAGING:  ?I have independently reviewed the scans and discussed with the patient. ?No results found.  ? ?ASSESSMENT:  ?Leukopenia and neutropenia: ?- Patient seen at the request of Dr. Posey Pronto for neutropenia and leukopenia. ?- CBC on 07/20/2021 with white count 2.2.  Normal platelet count and hemoglobin.  No differential. ?- CBCD on 11/03/2021 with white count 2.5, differential 44% neutrophils, 45% lymphocytes, 8% monocytes, 1% basophils and 2% eosinophils.  ANC was 1100. ?- White count was normal at 8.50 in August 2017 and February 2017.  It was low at 3.2 in 2013.  He has some ?- Denies any B symptoms.  Denies any new medications, OTC supplements.  No prior history of blood transfusion. ? ?  ?Social/family history: ?- He is seen along with his mother.  He works as a Retail buyer.  No chemical exposure.  Non-smoker. ?- No family history of leukopenia.  Paternal grandmother had leukemia.  Mother had colon cancer. ? ? ?PLAN:  ?Leukopenia and neutropenia: ?- We have reviewed labs from 11/22/2021.  White count is 2.1 with ANC of 900.  Platelet count and hemoglobin were normal.  SPEP was negative. ?- ANA and rheumatoid factor, hepatitis B serology was negative.  Nutritional deficiency work-up was also negative.  LDH was normal. ?- He had intermittent neutropenia and leukopenia since 2013. ?- No B symptoms.  No recurrent infections. ?- I have discussed further work-up in the form of bone marrow biopsy.  As he does not have any B symptoms or recurrent infections, I think it is okay to continue to follow him until he has a bone marrow biopsy at this time.  He is agreeable. ?- RTC 6 months for follow-up.  I plan to do a flow cytometry along with a repeat CBC and LDH at that time. ?- He reported some  dyspnea on exertion lately.  However he has gained about 40 pounds which could be contributing to that symptom. ? ?Orders placed this encounter:  ?No orders of the defined types were placed in this encounter

## 2021-12-22 NOTE — Patient Instructions (Addendum)
[facility] at [facility] ?Discharge Instructions ? ?You were seen and examined today by Dr. Ellin Saba. He reviewed your most recent labs and everything looks stable. Your blood counts should not be causing your shortness of breath with exertion. Please keep follow up appointments as scheduled in 6 months. ? ? ?Thank you for choosing [facility] at [facility] to provide your oncology and hematology care.  To afford each patient quality time with our provider, please arrive at least 15 minutes before your scheduled appointment time.  ? ?If you have a lab appointment with the Cancer Center please come in thru the Main Entrance and check in at the main information desk. ? ?You need to re-schedule your appointment should you arrive 10 or more minutes late.  We strive to give you quality time with our providers, and arriving late affects you and other patients whose appointments are after yours.  Also, if you no show three or more times for appointments you may be dismissed from the clinic at the providers discretion.     ?Again, thank you for choosing [facility].  Our hope is that these requests will decrease the amount of time that you wait before being seen by our physicians.       ?_____________________________________________________________ ? ?Should you have questions after your visit to [facility], please contact our office at [phone] and follow the prompts.  Our office hours are 8:00 a.m. and 4:30 p.m. Monday - Friday.  Please note that voicemails left after 4:00 p.m. may not be returned until the following business day.  We are closed weekends and major holidays.  You do have access to a nurse 24-7, just call the main number to the clinic [phone] and do not press any options, hold on the line and a nurse will answer the phone.   ? ?For prescription refill requests, have your pharmacy contact our office and allow 72 hours.    ? ?Due to Covid, you will need to wear a mask upon entering the hospital. If you do not have a mask, a mask will be given to you at the Main Entrance upon arrival. For doctor visits, patients may have 1 support person age 49 or older with them. For treatment visits, patients can not have anyone with them due to social distancing guidelines and our immunocompromised population.  ? ?  ?

## 2022-01-20 ENCOUNTER — Ambulatory Visit: Payer: Medicare Other | Admitting: Internal Medicine

## 2022-01-20 ENCOUNTER — Telehealth: Payer: Self-pay

## 2022-01-20 ENCOUNTER — Other Ambulatory Visit: Payer: Self-pay | Admitting: *Deleted

## 2022-01-20 DIAGNOSIS — E782 Mixed hyperlipidemia: Secondary | ICD-10-CM

## 2022-01-20 MED ORDER — ROSUVASTATIN CALCIUM 20 MG PO TABS: 20.0000 mg | ORAL_TABLET | Freq: Every day | ORAL | 1 refills | Status: DC

## 2022-01-20 NOTE — Telephone Encounter (Signed)
Patient came into office need med refill on blood pressure meds, enough to get him til his next appt 05.22.2023. ? ?rosuvastatin (CRESTOR) 20 MG tablet  ? ?Pharmacy: [facility] [address] [place] ?

## 2022-01-20 NOTE — Telephone Encounter (Signed)
Medication provided.

## 2022-01-24 ENCOUNTER — Telehealth: Payer: Self-pay

## 2022-01-24 NOTE — Telephone Encounter (Signed)
This has been sent to pharmacy.

## 2022-01-24 NOTE — Telephone Encounter (Signed)
Patient called need med refill  ?rosuvastatin (CRESTOR) 20 MG tablet  ? ?[facility] [address] [place] ?

## 2022-01-25 ENCOUNTER — Other Ambulatory Visit: Payer: Self-pay | Admitting: Internal Medicine

## 2022-01-25 DIAGNOSIS — I10 Essential (primary) hypertension: Secondary | ICD-10-CM

## 2022-02-14 ENCOUNTER — Encounter: Payer: Self-pay | Admitting: Internal Medicine

## 2022-02-14 ENCOUNTER — Ambulatory Visit (INDEPENDENT_AMBULATORY_CARE_PROVIDER_SITE_OTHER): Payer: Medicare Other | Admitting: Internal Medicine

## 2022-02-14 VITALS — BP 124/88 | HR 68 | Resp 18 | Ht 70.0 in | Wt 257.8 lb

## 2022-02-14 DIAGNOSIS — E782 Mixed hyperlipidemia: Secondary | ICD-10-CM | POA: Diagnosis not present

## 2022-02-14 DIAGNOSIS — D72819 Decreased white blood cell count, unspecified: Secondary | ICD-10-CM

## 2022-02-14 DIAGNOSIS — G809 Cerebral palsy, unspecified: Secondary | ICD-10-CM | POA: Diagnosis not present

## 2022-02-14 DIAGNOSIS — I10 Essential (primary) hypertension: Secondary | ICD-10-CM

## 2022-02-14 NOTE — Assessment & Plan Note (Signed)
On Crestor Lipid profile reviewed 

## 2022-02-14 NOTE — Assessment & Plan Note (Signed)
BP Readings from Last 1 Encounters:  02/14/22 124/88   Well-controlled with telmisartan 20 mg daily Advised DASH diet and moderate exercise/walking, at least 150 mins/week

## 2022-02-14 NOTE — Assessment & Plan Note (Signed)
History of cerebral palsy, has learning disability Able to ambulate without support Works as a janitor currently 

## 2022-02-14 NOTE — Assessment & Plan Note (Signed)
CBC reviewed - showed leukopenia Had Heme/Onc. eval - plan to check CBC and flow cytometry after 6 months

## 2022-02-14 NOTE — Patient Instructions (Addendum)
Please continue taking medications as prescribed.  Please continue to follow low salt diet and perform moderate exercise/walking at least 150 mins/week. 

## 2022-02-14 NOTE — Progress Notes (Signed)
Established Patient Office Visit  Subjective:  Patient ID: Nicholas Dunlap, male    DOB: 02/01/1981  Age: 41 y.o. MRN: [id number]  CC:  Chief Complaint  Patient presents with   Follow-up    3 month follow up HTN     HPI Nicholas Dunlap is a 41 y.o. male with past medical history of cerebral palsy, HTN and HLD who presents for f/u of his chronic medical conditions.  HTN: BP is well-controlled now. Takes medications regularly. Patient denies headache, dizziness, chest pain, dyspnea or palpitations.  He was evaluated by Heme/Onc. for leukopenia, but his WBC counts were deemed to be stable. He denies any fever, chills, night sweats or unintentional weight loss.   Past Medical History:  Diagnosis Date   Cerebral palsy ([place])    CLOSED FRACTURE UNSPEC PHALANX/PHALANGES HAND 08/01/2007   Qualifier: Diagnosis of  By: Aline Brochure MD, [place]     Eczema    chest, buttock   Finger fracture, left 09/07/2016   fx./dislocation ring finger   Hyperlipidemia    Limp     Past Surgical History:  Procedure Laterality Date   FINGER ARTHROPLASTY Left 09/13/2016   Procedure: LEFT RING FINGER VOLAR-PLATE REPAIR;  Surgeon: Milly Jakob, MD;  Location: [place];  Service: Orthopedics;  Laterality: Left;   FOOT CAPSULE RELEASE W/ PERCUTANEOUS HEEL CORD LENGTHENING, TIBIAL TENDON TRANSFER Left    as a child    History reviewed. No pertinent family history.  Social History   Socioeconomic History   Marital status: Single    Spouse name: Not on file   Number of children: 0   Years of education: 12   Highest education level: Not on file  Occupational History   Not on file  Tobacco Use   Smoking status: Never   Smokeless tobacco: Never  Vaping Use   Vaping Use: Never used  Substance and Sexual Activity   Alcohol use: No   Drug use: No   Sexual activity: Not Currently  Other Topics Concern   Not on file  Social History Narrative   Not on file   Social  Determinants of Health   Financial Resource Strain: Low Risk    Difficulty of Paying Living Expenses: Not hard at all  Food Insecurity: No Food Insecurity   Worried About Charity fundraiser in the Last Year: Never true   [place] in the Last Year: Never true  Transportation Needs: No Transportation Needs   Lack of Transportation (Medical): No   Lack of Transportation (Non-Medical): No  Physical Activity: Sufficiently Active   Days of Exercise per Week: 3 days   Minutes of Exercise per Session: 60 min  Stress: No Stress Concern Present   Feeling of Stress : Not at all  Social Connections: Moderately Isolated   Frequency of Communication with Friends and Family: Once a week   Frequency of Social Gatherings with Friends and Family: Once a week   Attends Religious Services: More than 4 times per year   Active Member of [employer] or Organizations: Yes   Attends Music therapist: More than 4 times per year   Marital Status: Never married  Human resources officer Violence: Not At Risk   Fear of Current or Ex-Partner: No   Emotionally Abused: No   Physically Abused: No   Sexually Abused: No    Outpatient Medications Prior to Visit  Medication Sig Dispense Refill   Cholecalciferol (D3 5000 PO) Take by mouth.  Take 1daily     rosuvastatin (CRESTOR) 20 MG tablet Take 1 tablet (20 mg total) by mouth daily. 90 tablet 1   telmisartan (MICARDIS) 20 MG tablet TAKE 1 TABLET(20 MG) BY MOUTH DAILY 30 tablet 2   No facility-administered medications prior to visit.    Allergies  Allergen Reactions   Milk-Related Compounds Shortness Of Breath    ROS Review of Systems  Constitutional:  Negative for chills and fever.  HENT:  Negative for congestion and sore throat.   Eyes:  Negative for pain and discharge.  Respiratory:  Negative for cough and shortness of breath.   Cardiovascular:  Negative for chest pain and palpitations.  Gastrointestinal:  Negative for constipation, diarrhea,  nausea and vomiting.  Endocrine: Negative for polydipsia and polyuria.  Genitourinary:  Negative for dysuria and hematuria.  Musculoskeletal:  Negative for neck pain and neck stiffness.  Skin:  Negative for rash.  Neurological:  Negative for dizziness, weakness, numbness and headaches.  Psychiatric/Behavioral:  Negative for agitation and behavioral problems.      Objective:    Physical Exam Vitals reviewed.  Constitutional:      General: He is not in acute distress.    Appearance: He is not diaphoretic.  HENT:     Head: Normocephalic and atraumatic.     Nose: Nose normal.     Mouth/Throat:     Mouth: Mucous membranes are moist.  Eyes:     General: No scleral icterus.    Extraocular Movements: Extraocular movements intact.     Comments: B/l strabismus  Cardiovascular:     Rate and Rhythm: Normal rate and regular rhythm.     Pulses: Normal pulses.     Heart sounds: Normal heart sounds. No murmur heard. Pulmonary:     Breath sounds: Normal breath sounds. No wheezing or rales.  Musculoskeletal:     Cervical back: Neck supple. No tenderness.     Right lower leg: No edema.     Left lower leg: No edema.  Skin:    General: Skin is warm.     Findings: No rash.  Neurological:     General: No focal deficit present.     Mental Status: He is alert and oriented to person, place, and time.     Sensory: No sensory deficit.     Motor: No weakness.  Psychiatric:        Mood and Affect: Mood normal.        Behavior: Behavior normal.    BP 124/88 (BP Location: Left Arm, Patient Position: Sitting, Cuff Size: Normal)   Pulse 68   Resp 18   Ht _0  (1.778 m)   Wt 257 lb 12.8 oz (116.9 kg)   SpO2 98%   BMI 36.99 kg/m  Wt Readings from Last 3 Encounters:  02/14/22 257 lb 12.8 oz (116.9 kg)  12/22/21 263 lb 9.6 oz (119.6 kg)  11/22/21 263 lb 8 oz (119.5 kg)    No results found for: TSH Lab Results  Component Value Date   WBC 2.1 (L) 11/22/2021   HGB 15.4 11/22/2021   HCT  47.6 11/22/2021   MCV 82.8 11/22/2021   PLT 154 11/22/2021   Lab Results  Component Value Date   NA 139 11/03/2021   K 4.6 11/03/2021   CO2 24 11/03/2021   GLUCOSE 79 11/03/2021   BUN 11 11/03/2021   CREATININE 1.23 11/03/2021   BILITOT 1.0 07/20/2021   ALKPHOS 64 07/20/2021   AST 42 (H) 07/20/2021  ALT 35 07/20/2021   PROT 7.5 07/20/2021   ALBUMIN 4.6 07/20/2021   CALCIUM 9.8 11/03/2021   ANIONGAP 5 05/18/2016   EGFR 75 11/03/2021   Lab Results  Component Value Date   CHOL 157 07/20/2021   Lab Results  Component Value Date   HDL 74 07/20/2021   Lab Results  Component Value Date   LDLCALC 75 07/20/2021   Lab Results  Component Value Date   TRIG 33 07/20/2021   Lab Results  Component Value Date   CHOLHDL 2.1 07/20/2021   No results found for: HGBA1C    Assessment & Plan:   Problem List Items Addressed This Visit       Cardiovascular and Mediastinum   Essential hypertension - Primary    BP Readings from Last 1 Encounters:  02/14/22 124/88  Well-controlled with telmisartan 20 mg daily Advised DASH diet and moderate exercise/walking, at least 150 mins/week        Nervous and Auditory   Cerebral palsy ([place])    History of cerebral palsy, has learning disability Able to ambulate without support Works as a Retail buyer currently         Other   Mixed hyperlipidemia    On Crestor Lipid profile reviewed       Leukopenia    CBC reviewed - showed leukopenia Had Heme/Onc. eval - plan to check CBC and flow cytometry after 6 months        No orders of the defined types were placed in this encounter.   Follow-up: Return in about 6 months (around 08/17/2022) for HTN and leukopenia.    Lindell Spar, MD

## 2022-03-16 ENCOUNTER — Encounter: Payer: Self-pay | Admitting: Family Medicine

## 2022-03-16 ENCOUNTER — Ambulatory Visit (INDEPENDENT_AMBULATORY_CARE_PROVIDER_SITE_OTHER): Payer: Medicare Other | Admitting: Family Medicine

## 2022-03-16 VITALS — BP 142/98 | HR 89 | Ht 70.0 in | Wt 264.2 lb

## 2022-03-16 DIAGNOSIS — B351 Tinea unguium: Secondary | ICD-10-CM

## 2022-03-16 DIAGNOSIS — B353 Tinea pedis: Secondary | ICD-10-CM | POA: Diagnosis not present

## 2022-03-16 DIAGNOSIS — E782 Mixed hyperlipidemia: Secondary | ICD-10-CM

## 2022-03-16 DIAGNOSIS — B49 Unspecified mycosis: Secondary | ICD-10-CM | POA: Diagnosis not present

## 2022-03-16 MED ORDER — KETOCONAZOLE 2 % EX CREA: 1.0000 | TOPICAL_CREAM | Freq: Every day | CUTANEOUS | 0 refills | Status: DC

## 2022-03-16 MED ORDER — TERBINAFINE HCL 250 MG PO TABS: 250.0000 mg | ORAL_TABLET | Freq: Every day | ORAL | 0 refills | Status: DC

## 2022-03-16 NOTE — Progress Notes (Signed)
sh

## 2022-03-16 NOTE — Progress Notes (Signed)
Established Patient Office Visit  Subjective:  Patient ID: Nicholas Dunlap, male    DOB: Sep 23, 1981  Age: 41 y.o. MRN: [id number]  CC:  Chief Complaint  Patient presents with   Foot Problem    Pt c/o rash on left foot mainly and little spot on his right foot. Rash is red and itchy. Started 1 week ago.     HPI Nicholas Dunlap is a 41 y.o. male with past medical history of essential hypertension and cerebral palsy presents with c/o of rash on his Left and right foot Tinea pedis and onychomycosis: report a rash on the Left and right foot since 03/10/22. The rash is dry and itches. No pain reported. He peeled the skin off the rash on his left foot, and caused a sore. He notes having fungus infection of the toes for years.   Past Medical History:  Diagnosis Date   Cerebral palsy ([place])    CLOSED FRACTURE UNSPEC PHALANX/PHALANGES HAND 08/01/2007   Qualifier: Diagnosis of  By: Aline Brochure MD, [place]     Eczema    chest, buttock   Finger fracture, left 09/07/2016   fx./dislocation ring finger   Hyperlipidemia    Limp     Past Surgical History:  Procedure Laterality Date   FINGER ARTHROPLASTY Left 09/13/2016   Procedure: LEFT RING FINGER VOLAR-PLATE REPAIR;  Surgeon: Milly Jakob, MD;  Location: [facility];  Service: Orthopedics;  Laterality: Left;   FOOT CAPSULE RELEASE W/ PERCUTANEOUS HEEL CORD LENGTHENING, TIBIAL TENDON TRANSFER Left    as a child    History reviewed. No pertinent family history.  Social History   Socioeconomic History   Marital status: Single    Spouse name: Not on file   Number of children: 0   Years of education: 12   Highest education level: Not on file  Occupational History   Not on file  Tobacco Use   Smoking status: Never   Smokeless tobacco: Never  Vaping Use   Vaping Use: Never used  Substance and Sexual Activity   Alcohol use: No   Drug use: No   Sexual activity: Not Currently  Other Topics Concern   Not on file   Social History Narrative   Not on file   Social Determinants of Health   Financial Resource Strain: Low Risk  (10/28/2021)   Overall Financial Resource Strain (CARDIA)    Difficulty of Paying Living Expenses: Not hard at all  Food Insecurity: No Food Insecurity (10/28/2021)   Hunger Vital Sign    Worried About Running Out of Food in the Last Year: Never true    [place] in the Last Year: Never true  Transportation Needs: No Transportation Needs (10/28/2021)   PRAPARE - Hydrologist (Medical): No    Lack of Transportation (Non-Medical): No  Physical Activity: Sufficiently Active (10/28/2021)   Exercise Vital Sign    Days of Exercise per Week: 3 days    Minutes of Exercise per Session: 60 min  Stress: No Stress Concern Present (10/28/2021)   [place]    Feeling of Stress : Not at all  Social Connections: Moderately Isolated (10/28/2021)   Social Connection and Isolation Panel [NHANES]    Frequency of Communication with Friends and Family: Once a week    Frequency of Social Gatherings with Friends and Family: Once a week    Attends Religious Services: More than 4 times  per year    Active Member of Clubs or Organizations: Yes    Attends Archivist Meetings: More than 4 times per year    Marital Status: Never married  Intimate Partner Violence: Not At Risk (10/28/2021)   Humiliation, Afraid, Rape, and Kick questionnaire    Fear of Current or Ex-Partner: No    Emotionally Abused: No    Physically Abused: No    Sexually Abused: No    Outpatient Medications Prior to Visit  Medication Sig Dispense Refill   Cholecalciferol (D3 5000 PO) Take by mouth. Take 1daily     rosuvastatin (CRESTOR) 20 MG tablet Take 1 tablet (20 mg total) by mouth daily. 90 tablet 1   telmisartan (MICARDIS) 20 MG tablet TAKE 1 TABLET(20 MG) BY MOUTH DAILY 30 tablet 2   No facility-administered medications  prior to visit.    Allergies  Allergen Reactions   Milk-Related Compounds Shortness Of Breath    ROS Review of Systems  Constitutional:  Negative for chills, fatigue and fever.  HENT:  Negative for congestion and dental problem.   Eyes:  Negative for photophobia and redness.  Respiratory:  Negative for apnea and chest tightness.   Genitourinary:  Negative for difficulty urinating and dysuria.  Skin:  Positive for rash.      Objective:    Physical Exam HENT:     Head: Normocephalic.  Cardiovascular:     Rate and Rhythm: Normal rate and regular rhythm.     Pulses: Normal pulses.     Heart sounds: Normal heart sounds.  Pulmonary:     Effort: Pulmonary effort is normal.     Breath sounds: Normal breath sounds.  Skin:    Findings: Lesion (macerated scaling lesions affecting the soles, interdigital clefts of toes) and rash present.     Comments: Thickened toenails and subungual debris noted   Neurological:     Mental Status: He is alert.     BP (!) 142/98   Pulse 89   Ht 5' 10"  (1.778 m)   Wt 264 lb 3.2 oz (119.8 kg)   SpO2 99%   BMI 37.91 kg/m  Wt Readings from Last 3 Encounters:  03/16/22 264 lb 3.2 oz (119.8 kg)  02/14/22 257 lb 12.8 oz (116.9 kg)  12/22/21 263 lb 9.6 oz (119.6 kg)    No results found for: "TSH" Lab Results  Component Value Date   WBC 2.1 (L) 11/22/2021   HGB 15.4 11/22/2021   HCT 47.6 11/22/2021   MCV 82.8 11/22/2021   PLT 154 11/22/2021   Lab Results  Component Value Date   NA 141 03/16/2022   K 4.5 03/16/2022   CO2 23 03/16/2022   GLUCOSE 88 03/16/2022   BUN 12 03/16/2022   CREATININE 1.06 03/16/2022   BILITOT 0.9 03/16/2022   ALKPHOS 93 03/16/2022   AST 30 03/16/2022   ALT 30 03/16/2022   PROT 6.8 03/16/2022   ALBUMIN 4.3 03/16/2022   CALCIUM 9.0 03/16/2022   ANIONGAP 5 05/18/2016   EGFR 90 03/16/2022   Lab Results  Component Value Date   CHOL 157 07/20/2021   Lab Results  Component Value Date   HDL 74 07/20/2021    Lab Results  Component Value Date   LDLCALC 75 07/20/2021   Lab Results  Component Value Date   TRIG 33 07/20/2021   Lab Results  Component Value Date   CHOLHDL 2.1 07/20/2021   No results found for: "HGBA1C"    Assessment & Plan:  Problem List Items Addressed This Visit       Musculoskeletal and Integument   Tinea pedis    Will treat with ketoconazole cream      Relevant Medications   ketoconazole (NIZORAL) 2 % cream   terbinafine (LAMISIL) 250 MG tablet   Onychomycosis    -Chronic -Reports having condition for years -Will treat with oral lamisil  -Liver function are within normal limits      Relevant Medications   ketoconazole (NIZORAL) 2 % cream   terbinafine (LAMISIL) 250 MG tablet     Other   Mixed hyperlipidemia   Relevant Orders   CMP14+EGFR (Completed)   Other Visit Diagnoses     Fungal infection    -  Primary   Relevant Medications   ketoconazole (NIZORAL) 2 % cream   terbinafine (LAMISIL) 250 MG tablet       Meds ordered this encounter  Medications   ketoconazole (NIZORAL) 2 % cream    Sig: Apply 1 Application topically daily.    Dispense:  15 g    Refill:  0   terbinafine (LAMISIL) 250 MG tablet    Sig: Take 1 tablet (250 mg total) by mouth daily.    Dispense:  90 tablet    Refill:  0    Follow-up: No follow-ups on file.    Alvira Monday, FNP

## 2022-03-16 NOTE — Patient Instructions (Signed)
I appreciate the opportunity to provide care to you today!  Please pick up your prescription at the pharmacy    Please continue to a heart-healthy diet and increase your physical activities. Try to exercise for 30mins at least three times a week.      It was a pleasure to see you and I look forward to continuing to work together on your health and well-being. Please do not hesitate to call the office if you need care or have questions about your care.   Have a wonderful day and week. With Gratitude, Emrys Mceachron MSN, FNP-BC   

## 2022-03-17 DIAGNOSIS — B351 Tinea unguium: Secondary | ICD-10-CM | POA: Insufficient documentation

## 2022-03-17 DIAGNOSIS — B353 Tinea pedis: Secondary | ICD-10-CM | POA: Insufficient documentation

## 2022-03-17 LAB — CMP14+EGFR
ALT: 30 IU/L (ref 0–44)
AST: 30 IU/L (ref 0–40)
Albumin/Globulin Ratio: 1.7 (ref 1.2–2.2)
Albumin: 4.3 g/dL (ref 4.0–5.0)
Alkaline Phosphatase: 93 IU/L (ref 44–121)
BUN/Creatinine Ratio: 11 (ref 9–20)
BUN: 12 mg/dL (ref 6–24)
Bilirubin Total: 0.9 mg/dL (ref 0.0–1.2)
CO2: 23 mmol/L (ref 20–29)
Calcium: 9 mg/dL (ref 8.7–10.2)
Chloride: 105 mmol/L (ref 96–106)
Creatinine, Ser: 1.06 mg/dL (ref 0.76–1.27)
Globulin, Total: 2.5 g/dL (ref 1.5–4.5)
Glucose: 88 mg/dL (ref 70–99)
Potassium: 4.5 mmol/L (ref 3.5–5.2)
Sodium: 141 mmol/L (ref 134–144)
Total Protein: 6.8 g/dL (ref 6.0–8.5)
eGFR: 90 mL/min/{1.73_m2} (ref >59)

## 2022-03-17 NOTE — Assessment & Plan Note (Signed)
Will treat with ketoconazole cream 

## 2022-03-17 NOTE — Assessment & Plan Note (Addendum)
-  Chronic -Reports having condition for years -Will treat with oral lamisil  -Liver function are within normal limits

## 2022-03-17 NOTE — Progress Notes (Signed)
Please inform the patient that his liver function is normal and her can take the lamisil tablets prescribed for his fungus infections.

## 2022-04-07 ENCOUNTER — Telehealth: Payer: Self-pay

## 2022-04-07 NOTE — Telephone Encounter (Signed)
ERROR

## 2022-04-08 ENCOUNTER — Other Ambulatory Visit: Payer: Self-pay | Admitting: Family Medicine

## 2022-04-15 ENCOUNTER — Ambulatory Visit (INDEPENDENT_AMBULATORY_CARE_PROVIDER_SITE_OTHER): Payer: Medicare Other | Admitting: Nurse Practitioner

## 2022-04-15 ENCOUNTER — Encounter: Payer: Self-pay | Admitting: Nurse Practitioner

## 2022-04-15 VITALS — BP 131/77 | HR 74 | Ht 70.0 in | Wt 263.0 lb

## 2022-04-15 DIAGNOSIS — L309 Dermatitis, unspecified: Secondary | ICD-10-CM

## 2022-04-15 MED ORDER — TRIAMCINOLONE ACETONIDE 0.1 % EX CREA: 1.0000 | TOPICAL_CREAM | Freq: Two times a day (BID) | CUTANEOUS | 0 refills | Status: DC

## 2022-04-15 MED ORDER — HYDROXYZINE PAMOATE 25 MG PO CAPS: 25.0000 mg | ORAL_CAPSULE | Freq: Three times a day (TID) | ORAL | 0 refills | Status: DC | PRN

## 2022-04-15 NOTE — Patient Instructions (Addendum)
Please start applying kenalog craem twice daily on your skin .   Take hydroxyzine 25mg  every 8 hours as needed for itching.   Apply moisturizing cream like cerave cream daily, you can buy this over the counter at your pharmacy , avoid hot shower/bath, harsh skin products   Please follw up with dermatology    Thanks for choosing [facility], we consider it a privelige to serve you.

## 2022-04-15 NOTE — Progress Notes (Signed)
   Nicholas Dunlap     MRN: [id number]      DOB: [birth year]   HPI Nicholas Dunlap with past medical history of cerebral palsy, essential hypertension mixed hyperlipidemia leukopenia is here for complaints of rashes.  Patient c/o of chronic itchy rashes on both arms and legs worse since the past 3 weeks . He has seen a dermatologist many years ago about his skin condition but not currently.  patient denies fever, chills, malaise.       ROS Denies recent fever or chills. Denies sinus pressure, nasal congestion, ear pain or sore throat. Denies chest congestion, productive cough or wheezing. Denies chest pains, palpitations and leg swelling Denies abdominal pain, nausea, vomiting,diarrhea or constipation.   Denies dysuria, frequency, hesitancy or incontinence. Denies joint pain, swelling and limitation in mobility. Denies headaches, seizures, numbness, or tingling. Denies depression, anxiety or insomnia.    PE  BP 131/77 (BP Location: Right Arm, Patient Position: Sitting, Cuff Size: Large)   Pulse 74   Ht 5\' 10"  (1.778 m)   Wt 263 lb (119.3 kg)   SpO2 97%   BMI 37.74 kg/m   Patient alert and oriented and in no cardiopulmonary distress.  Chest: Clear to auscultation bilaterally.  CVS: S1, S2 no murmurs, no S3.Regular rate.  ABD: Soft non tender.    MS: Adequate ROM spine, shoulders, hips and knees.  Skin: Generalized slightly scaly patches, noted on both upper and lower extremities , excoriation marks noted on left foot. No redness, drainage noted   Psych: Good eye contact, normal affect. Memory intact not anxious or depressed appearing.  CNS: CN 2-12 intact, power,  normal throughout.no focal deficits noted.   Assessment & Plan  Eczema Rx Kenalog 0.1% current applied to affected areas twice daily Hydroxyzine 25 mg 3 times daily as needed itching Patient encouraged to use good moisturizing creams like CeraVe avoid hot showers and bath, brush skin products Patient  referred to dermatologist

## 2022-04-15 NOTE — Assessment & Plan Note (Signed)
Rx Kenalog 0.1% current applied to affected areas twice daily Hydroxyzine 25 mg 3 times daily as needed itching Patient encouraged to use good moisturizing creams like CeraVe avoid hot showers and bath, brush skin products Patient referred to dermatologist

## 2022-04-24 ENCOUNTER — Other Ambulatory Visit: Payer: Self-pay | Admitting: Internal Medicine

## 2022-04-24 DIAGNOSIS — I10 Essential (primary) hypertension: Secondary | ICD-10-CM

## 2022-06-13 ENCOUNTER — Encounter (HOSPITAL_COMMUNITY)
Admission: EM | Disposition: A | Discharge status: Home / Self Care | Payer: Self-pay | Source: Home / Self Care | Attending: Emergency Medicine

## 2022-06-13 ENCOUNTER — Emergency Department (HOSPITAL_COMMUNITY): Payer: Medicare Other | Admitting: Anesthesiology

## 2022-06-13 ENCOUNTER — Other Ambulatory Visit: Payer: Self-pay

## 2022-06-13 ENCOUNTER — Encounter (HOSPITAL_COMMUNITY): Payer: Self-pay | Admitting: *Deleted

## 2022-06-13 ENCOUNTER — Emergency Department (HOSPITAL_COMMUNITY): Payer: Medicare Other

## 2022-06-13 ENCOUNTER — Emergency Department (HOSPITAL_COMMUNITY)
Admission: EM | Admit: 2022-06-13 | Discharge: 2022-06-13 | Disposition: A | Discharge status: Home / Self Care | Payer: Medicare Other | Attending: General Surgery | Admitting: General Surgery

## 2022-06-13 DIAGNOSIS — E785 Hyperlipidemia, unspecified: Secondary | ICD-10-CM | POA: Insufficient documentation

## 2022-06-13 DIAGNOSIS — G809 Cerebral palsy, unspecified: Secondary | ICD-10-CM | POA: Insufficient documentation

## 2022-06-13 DIAGNOSIS — K5900 Constipation, unspecified: Secondary | ICD-10-CM | POA: Diagnosis present

## 2022-06-13 DIAGNOSIS — N281 Cyst of kidney, acquired: Secondary | ICD-10-CM | POA: Insufficient documentation

## 2022-06-13 DIAGNOSIS — K603 Anal fistula, unspecified: Secondary | ICD-10-CM

## 2022-06-13 DIAGNOSIS — K5730 Diverticulosis of large intestine without perforation or abscess without bleeding: Secondary | ICD-10-CM | POA: Insufficient documentation

## 2022-06-13 DIAGNOSIS — K6289 Other specified diseases of anus and rectum: Secondary | ICD-10-CM | POA: Insufficient documentation

## 2022-06-13 DIAGNOSIS — I10 Essential (primary) hypertension: Secondary | ICD-10-CM | POA: Insufficient documentation

## 2022-06-13 DIAGNOSIS — K610 Anal abscess: Secondary | ICD-10-CM | POA: Diagnosis not present

## 2022-06-13 DIAGNOSIS — K611 Rectal abscess: Secondary | ICD-10-CM

## 2022-06-13 HISTORY — PX: INCISION AND DRAINAGE PERIRECTAL ABSCESS: SHX1804

## 2022-06-13 LAB — URINALYSIS, ROUTINE W REFLEX MICROSCOPIC
Bilirubin Urine: NEGATIVE
Glucose, UA: NEGATIVE mg/dL
Hgb urine dipstick: NEGATIVE
Ketones, ur: NEGATIVE mg/dL
Leukocytes,Ua: NEGATIVE
Nitrite: NEGATIVE
Protein, ur: NEGATIVE mg/dL
Specific Gravity, Urine: 1.017 (ref 1.005–1.030)
pH: 5 (ref 5.0–8.0)

## 2022-06-13 LAB — CBC
HCT: 48.7 % (ref 39.0–52.0)
Hemoglobin: 15.6 g/dL (ref 13.0–17.0)
MCH: 25.9 pg — ABNORMAL LOW (ref 26.0–34.0)
MCHC: 32 g/dL (ref 30.0–36.0)
MCV: 80.8 fL (ref 80.0–100.0)
Platelets: 176 10*3/uL (ref 150–400)
RBC: 6.03 MIL/uL — ABNORMAL HIGH (ref 4.22–5.81)
RDW: 13.3 % (ref 11.5–15.5)
WBC: 3.9 10*3/uL — ABNORMAL LOW (ref 4.0–10.5)
nRBC: 0 % (ref 0.0–0.2)

## 2022-06-13 LAB — COMPREHENSIVE METABOLIC PANEL
ALT: 29 U/L (ref 0–44)
AST: 19 U/L (ref 15–41)
Albumin: 3.9 g/dL (ref 3.5–5.0)
Alkaline Phosphatase: 59 U/L (ref 38–126)
Anion gap: 7 (ref 5–15)
BUN: 11 mg/dL (ref 6–20)
CO2: 27 mmol/L (ref 22–32)
Calcium: 9.1 mg/dL (ref 8.9–10.3)
Chloride: 101 mmol/L (ref 98–111)
Creatinine, Ser: 0.95 mg/dL (ref 0.61–1.24)
GFR, Estimated: >60 mL/min (ref >60)
Glucose, Bld: 98 mg/dL (ref 70–99)
Potassium: 4.5 mmol/L (ref 3.5–5.1)
Sodium: 135 mmol/L (ref 135–145)
Total Bilirubin: 1.2 mg/dL (ref 0.3–1.2)
Total Protein: 7.7 g/dL (ref 6.5–8.1)

## 2022-06-13 LAB — LIPASE, BLOOD: Lipase: 28 U/L (ref 11–51)

## 2022-06-13 SURGERY — INCISION AND DRAINAGE, ABSCESS, PERIRECTAL
Anesthesia: General | Site: Rectum

## 2022-06-13 MED ORDER — ONDANSETRON HCL 4 MG/2ML IJ SOLN
INTRAMUSCULAR | Status: AC
  Filled 2022-06-13: qty 2

## 2022-06-13 MED ORDER — ONDANSETRON HCL 4 MG PO TABS: 4.0000 mg | ORAL_TABLET | Freq: Three times a day (TID) | ORAL | 1 refills | Status: AC | PRN

## 2022-06-13 MED ORDER — AMOXICILLIN-POT CLAVULANATE 875-125 MG PO TABS: 1.0000 | ORAL_TABLET | Freq: Two times a day (BID) | ORAL | 0 refills | Status: AC

## 2022-06-13 MED ORDER — PROPOFOL 10 MG/ML IV BOLUS
INTRAVENOUS | Status: AC
  Filled 2022-06-13: qty 20

## 2022-06-13 MED ORDER — DEXAMETHASONE SODIUM PHOSPHATE 10 MG/ML IJ SOLN
INTRAMUSCULAR | Status: DC | PRN
  Administered 2022-06-13: 10 mg via INTRAVENOUS

## 2022-06-13 MED ORDER — HYDROGEN PEROXIDE 3 % EX SOLN
CUTANEOUS | Status: DC | PRN
  Administered 2022-06-13: 1

## 2022-06-13 MED ORDER — PROPOFOL 10 MG/ML IV BOLUS
INTRAVENOUS | Status: DC | PRN
  Administered 2022-06-13 (×2): 50 mg via INTRAVENOUS
  Administered 2022-06-13: 200 mg via INTRAVENOUS

## 2022-06-13 MED ORDER — KETOROLAC TROMETHAMINE 15 MG/ML IJ SOLN
15.0000 mg | Freq: Once | INTRAMUSCULAR | Status: AC
  Administered 2022-06-13: 15 mg via INTRAVENOUS
  Filled 2022-06-13: qty 1

## 2022-06-13 MED ORDER — DOCUSATE SODIUM 100 MG PO CAPS: 100.0000 mg | ORAL_CAPSULE | Freq: Two times a day (BID) | ORAL | 2 refills | Status: AC

## 2022-06-13 MED ORDER — OXYCODONE HCL 5 MG PO TABS
5.0000 mg | ORAL_TABLET | Freq: Once | ORAL | Status: AC | PRN
  Administered 2022-06-13: 5 mg via ORAL
  Filled 2022-06-13: qty 1

## 2022-06-13 MED ORDER — CHLORHEXIDINE GLUCONATE 4 % EX LIQD
1.0000 | Freq: Once | CUTANEOUS | Status: AC
  Administered 2022-06-13: 1 via TOPICAL
  Filled 2022-06-13: qty 15

## 2022-06-13 MED ORDER — 0.9 % SODIUM CHLORIDE (POUR BTL) OPTIME
TOPICAL | Status: DC | PRN
  Administered 2022-06-13: 1000 mL

## 2022-06-13 MED ORDER — IOHEXOL 300 MG/ML  SOLN
100.0000 mL | Freq: Once | INTRAMUSCULAR | Status: AC | PRN
  Administered 2022-06-13: 100 mL via INTRAVENOUS

## 2022-06-13 MED ORDER — ORAL CARE MOUTH RINSE: 15.0000 mL | Freq: Once | OROMUCOSAL | Status: AC

## 2022-06-13 MED ORDER — LIDOCAINE VISCOUS HCL 2 % MT SOLN
OROMUCOSAL | Status: DC | PRN
  Administered 2022-06-13: 1 via OROMUCOSAL

## 2022-06-13 MED ORDER — SODIUM CHLORIDE 0.9 % IV SOLN
2.0000 g | INTRAVENOUS | Status: AC
  Administered 2022-06-13: 2 g via INTRAVENOUS
  Filled 2022-06-13: qty 20

## 2022-06-13 MED ORDER — OXYCODONE HCL 5 MG/5ML PO SOLN: 5.0000 mg | Freq: Once | ORAL | Status: AC | PRN

## 2022-06-13 MED ORDER — CHLORHEXIDINE GLUCONATE 0.12 % MT SOLN
15.0000 mL | Freq: Once | OROMUCOSAL | Status: AC
  Administered 2022-06-13: 15 mL via OROMUCOSAL
  Filled 2022-06-13: qty 15

## 2022-06-13 MED ORDER — AMOXICILLIN-POT CLAVULANATE 875-125 MG PO TABS
1.0000 | ORAL_TABLET | Freq: Once | ORAL | Status: AC
  Administered 2022-06-13: 1 via ORAL
  Filled 2022-06-13: qty 1

## 2022-06-13 MED ORDER — BUPIVACAINE LIPOSOME 1.3 % IJ SUSP
INTRAMUSCULAR | Status: DC | PRN
  Administered 2022-06-13: 20 mL

## 2022-06-13 MED ORDER — ONDANSETRON HCL 4 MG/2ML IJ SOLN
INTRAMUSCULAR | Status: DC | PRN
  Administered 2022-06-13: 4 mg via INTRAVENOUS

## 2022-06-13 MED ORDER — METHYLENE BLUE 1 % INJ SOLN
INTRAVENOUS | Status: AC
  Filled 2022-06-13: qty 10

## 2022-06-13 MED ORDER — IOHEXOL 350 MG/ML SOLN: 75.0000 mL | Freq: Once | INTRAVENOUS | Status: DC | PRN

## 2022-06-13 MED ORDER — HYDROCODONE-ACETAMINOPHEN 5-325 MG PO TABS
1.0000 | ORAL_TABLET | Freq: Once | ORAL | Status: AC
  Administered 2022-06-13: 1 via ORAL
  Filled 2022-06-13: qty 1

## 2022-06-13 MED ORDER — LIDOCAINE VISCOUS HCL 2 % MT SOLN
OROMUCOSAL | Status: AC
  Filled 2022-06-13: qty 15

## 2022-06-13 MED ORDER — OXYCODONE HCL 5 MG PO TABS: 5.0000 mg | ORAL_TABLET | ORAL | 0 refills | Status: DC | PRN

## 2022-06-13 MED ORDER — MIDAZOLAM HCL 2 MG/2ML IJ SOLN
INTRAMUSCULAR | Status: AC
  Filled 2022-06-13: qty 2

## 2022-06-13 MED ORDER — FENTANYL CITRATE (PF) 100 MCG/2ML IJ SOLN
INTRAMUSCULAR | Status: AC
  Filled 2022-06-13: qty 2

## 2022-06-13 MED ORDER — SODIUM CHLORIDE 0.9 % IV SOLN: INTRAVENOUS | Status: DC

## 2022-06-13 MED ORDER — ROCURONIUM BROMIDE 10 MG/ML (PF) SYRINGE
PREFILLED_SYRINGE | INTRAVENOUS | Status: AC
  Filled 2022-06-13: qty 10

## 2022-06-13 MED ORDER — LIDOCAINE HCL (PF) 2 % IJ SOLN
INTRAMUSCULAR | Status: AC
  Filled 2022-06-13: qty 5

## 2022-06-13 MED ORDER — FENTANYL CITRATE (PF) 100 MCG/2ML IJ SOLN
INTRAMUSCULAR | Status: DC | PRN
  Administered 2022-06-13 (×2): 100 ug via INTRAVENOUS

## 2022-06-13 MED ORDER — FENTANYL CITRATE PF 50 MCG/ML IJ SOSY
25.0000 ug | PREFILLED_SYRINGE | INTRAMUSCULAR | Status: DC | PRN
  Administered 2022-06-13: 50 ug via INTRAVENOUS
  Filled 2022-06-13: qty 1

## 2022-06-13 MED ORDER — ONDANSETRON HCL 4 MG/2ML IJ SOLN: 4.0000 mg | Freq: Once | INTRAMUSCULAR | Status: DC | PRN

## 2022-06-13 MED ORDER — FENTANYL CITRATE PF 50 MCG/ML IJ SOSY: 25.0000 ug | PREFILLED_SYRINGE | INTRAMUSCULAR | Status: DC | PRN

## 2022-06-13 MED ORDER — BUPIVACAINE LIPOSOME 1.3 % IJ SUSP
INTRAMUSCULAR | Status: AC
  Filled 2022-06-13: qty 20

## 2022-06-13 MED ORDER — MIDAZOLAM HCL 5 MG/5ML IJ SOLN
INTRAMUSCULAR | Status: DC | PRN
  Administered 2022-06-13: 2 mg via INTRAVENOUS

## 2022-06-13 MED ORDER — DEXAMETHASONE SODIUM PHOSPHATE 10 MG/ML IJ SOLN
INTRAMUSCULAR | Status: AC
  Filled 2022-06-13: qty 1

## 2022-06-13 SURGICAL SUPPLY — 32 items
BAG HAMPER (MISCELLANEOUS) ×2 IMPLANT
CANNULA VESSEL 3MM 2 BLNT TIP (CANNULA) IMPLANT
CLOTH BEACON ORANGE TIMEOUT ST (SAFETY) ×2 IMPLANT
COVER LIGHT HANDLE STERIS (MISCELLANEOUS) ×4 IMPLANT
COVER MAYO STAND XLG (MISCELLANEOUS) IMPLANT
DRAPE HALF SHEET 40X57 (DRAPES) ×2 IMPLANT
ELECT BLADE 6 FLAT ULTRCLN (ELECTRODE) IMPLANT
ELECT REM PT RETURN 9FT ADLT (ELECTROSURGICAL) ×1
ELECTRODE REM PT RTRN 9FT ADLT (ELECTROSURGICAL) ×2 IMPLANT
GAUZE SPONGE 4X4 12PLY STRL (GAUZE/BANDAGES/DRESSINGS) ×4 IMPLANT
GAUZE SPONGE 4X4 12PLY STRL LF (GAUZE/BANDAGES/DRESSINGS) IMPLANT
GLOVE BIO SURGEON STRL SZ 6.5 (GLOVE) ×2 IMPLANT
GLOVE BIOGEL PI IND STRL 6.5 (GLOVE) ×2 IMPLANT
GLOVE BIOGEL PI IND STRL 7.0 (GLOVE) ×4 IMPLANT
GOWN STRL REUS W/TWL LRG LVL3 (GOWN DISPOSABLE) ×4 IMPLANT
KIT TURNOVER KIT A (KITS) ×2 IMPLANT
MANIFOLD NEPTUNE II (INSTRUMENTS) ×2 IMPLANT
NEEDLE HYPO 22GX1.5 SAFETY (NEEDLE) ×2 IMPLANT
NS IRRIG 1000ML POUR BTL (IV SOLUTION) ×2 IMPLANT
PACK MINOR (CUSTOM PROCEDURE TRAY) ×2 IMPLANT
PAD ABD 5X9 TENDERSORB (GAUZE/BANDAGES/DRESSINGS) ×2 IMPLANT
PAD ARMBOARD 7.5X6 YLW CONV (MISCELLANEOUS) ×2 IMPLANT
SET BASIN LINEN APH (SET/KITS/TRAYS/PACK) ×2 IMPLANT
SHEET LAVH (DRAPES) IMPLANT
SPIKE FLUID TRANSFER (MISCELLANEOUS) ×2 IMPLANT
SPONGE SURGIFOAM ABS GEL 100 (HEMOSTASIS) IMPLANT
SURGILUBE 2OZ TUBE FLIPTOP (MISCELLANEOUS) IMPLANT
SUT ETHILON 3 0 FSL (SUTURE) IMPLANT
SUT SILK 0 FSL (SUTURE) IMPLANT
SYR 20ML LL LF (SYRINGE) IMPLANT
SYR BULB IRRIG 60ML STRL (SYRINGE) IMPLANT
SYR CONTROL 10ML LL (SYRINGE) ×2 IMPLANT

## 2022-06-13 NOTE — ED Provider Notes (Signed)
[facility] EMERGENCY DEPARTMENT Provider Note   CSN: [id number] Arrival date & time: 06/13/22  8850     History  No chief complaint on file.   Nicholas Dunlap is a 41 y.o. male.  With PMH of high functioning CP, perianal abscess who presents with perirectal pain for the past 2 to 3 days.  He says he has been having increased rectal pain for the past 2 to 3 days worse with sitting on his bottom and straining to have a bowel movement.  His last bowel movement was this morning but endorses increased constipation.  Did have some blood mixed with stool on the tissue 3 days ago but no recent bleeding and no melena.  He has been eating and drinking fine with no nausea, no vomiting, no fevers or chills.  He thinks he has diverticulosis.  He denies any history of colonoscopy or endoscopy.  When I asked him about previous perianal abscess, he is unsure and denies having it drained.  HPI     Home Medications Prior to Admission medications   Medication Sig Start Date End Date Taking? Authorizing Provider  amoxicillin-clavulanate (AUGMENTIN) 875-125 MG tablet Take 1 tablet by mouth 2 (two) times daily for 7 days. 06/13/22 06/20/22 Yes Lucretia Roers, MD  Cholecalciferol (D3 5000 PO) Take by mouth. Take 1daily   Yes [provider]  docusate sodium (COLACE) 100 MG capsule Take 1 capsule (100 mg total) by mouth 2 (two) times daily. 06/13/22 06/13/23 Yes Lucretia Roers, MD  hydrOXYzine (VISTARIL) 25 MG capsule Take 1 capsule (25 mg total) by mouth every 8 (eight) hours as needed. 04/15/22  Yes Paseda, Baird Kay, FNP  ketoconazole (NIZORAL) 2 % cream APPLY TOPICALLY TO THE AFFECTED AREA DAILY 04/08/22  Yes Gilmore Laroche, FNP  ondansetron (ZOFRAN) 4 MG tablet Take 1 tablet (4 mg total) by mouth every 8 (eight) hours as needed. 06/13/22 06/13/23 Yes Lucretia Roers, MD  oxyCODONE (ROXICODONE) 5 MG immediate release tablet Take 1 tablet (5 mg total) by mouth every 4 (four) hours as needed  for breakthrough pain or severe pain. 06/13/22 06/13/23 Yes Lucretia Roers, MD  rosuvastatin (CRESTOR) 20 MG tablet Take 1 tablet (20 mg total) by mouth daily. 01/20/22  Yes Anabel Halon, MD  telmisartan (MICARDIS) 20 MG tablet TAKE 1 TABLET(20 MG) BY MOUTH DAILY 04/25/22  Yes Anabel Halon, MD  terbinafine (LAMISIL) 250 MG tablet Take 1 tablet (250 mg total) by mouth daily. 03/16/22  Yes Gilmore Laroche, FNP  triamcinolone cream (KENALOG) 0.1 % Apply 1 Application topically 2 (two) times daily. 04/15/22  Yes Paseda, Baird Kay, FNP      Allergies    Milk-related compounds and Other    Review of Systems   Review of Systems  Physical Exam Updated Vital Signs BP (!) 141/94 (BP Location: Left Arm)   Pulse 68   Temp 97.8 F (36.6 C)   Resp (!) 24   Ht 5\' 10"  (1.778 m)   Wt 122.5 kg   SpO2 97%   BMI 38.74 kg/m  Physical Exam Constitutional: Alert and oriented. Slightly uncomfortable but nontoxic Eyes: Conjunctivae are normal. ENT      Head: Normocephalic and atraumatic.      Nose: No congestion.      Mouth/Throat: Mucous membranes are moist.      Neck: No stridor. Cardiovascular: S1, S2,  RRR Respiratory: Normal respiratory effort. Gastrointestinal: Soft and nontender. There is no CVA tenderness. Rectal region with  fluctuance and ttp of the 9-12 o  clock region, no erythema, no drainage, no laceration, no hemorrhoids. Musculoskeletal: Normal range of motion in all extremities.      Right lower leg: No tenderness or edema.      Left lower leg: No tenderness or edema. Neurologic: Normal speech and language. No gross focal neurologic deficits are appreciated. Skin: Skin is warm, dry and intact. No rash noted. Psychiatric: Mood and affect are normal. Speech and behavior are normal.  ED Results / Procedures / Treatments   Labs (all labs ordered are listed, but only abnormal results are displayed) Labs Reviewed  CBC - Abnormal; Notable for the following components:       Result Value   WBC 3.9 (*)    RBC 6.03 (*)    MCH 25.9 (*)    All other components within normal limits  LIPASE, BLOOD  COMPREHENSIVE METABOLIC PANEL  URINALYSIS, ROUTINE W REFLEX MICROSCOPIC    EKG None  Radiology CT ABDOMEN PELVIS W CONTRAST  Result Date: 06/13/2022 CLINICAL DATA:  LLQ pain, nausea, h/o diverticulosis EXAM: CT ABDOMEN AND PELVIS WITH CONTRAST TECHNIQUE: Multidetector CT imaging of the abdomen and pelvis was performed using the standard protocol following bolus administration of intravenous contrast. RADIATION DOSE REDUCTION: This exam was performed according to the departmental dose-optimization program which includes automated exposure control, adjustment of the mA and/or kV according to patient size and/or use of iterative reconstruction technique. CONTRAST:  100mL OMNIPAQUE IOHEXOL 300 MG/ML  SOLN COMPARISON:  CT 05/18/2016 FINDINGS: Lower chest: No acute abnormality. Hepatobiliary: No focal liver abnormality is seen. The gallbladder is unremarkable. Pancreas: Unremarkable. No pancreatic ductal dilatation or surrounding inflammatory changes. Spleen: Normal in size without focal abnormality. Adrenals/Urinary Tract: Adrenal glands are unremarkable. No hydronephrosis or nephrolithiasis. The bladder is mildly distended. There is a left lower pole renal cyst which appears simple in requires no follow-up imaging. Stomach/Bowel: The stomach is within normal limits. There is no evidence of bowel obstruction.Appendix is normal. Sigmoid diverticulosis. There is rectal wall thickening with adjacent stranding, and a left-sided perianal abscess which measures 3.4 x 1.2 cm with punctate focus of gas (series 3, image 91). There is no evidence of ischiorectal fossa extension. The patient has a history of perianal abscess as seen on prior CT in August 2017. The abscess was more complex and larger, measuring up to 4.5 x 3.3 cm at that time. Vascular/Lymphatic: No significant vascular findings are  present. No enlarged abdominal or pelvic lymph nodes. Reproductive: Unremarkable. Other: No abdominal wall hernia or abnormality. No abdominopelvic ascites. Musculoskeletal: No acute osseous abnormality. No suspicious osseous lesion. IMPRESSION: Perianal abscess measuring 3.4 x 1.2 cm, with associated rectal wall thickening and adjacent inflammatory stranding. Electronically Signed   By: Caprice RenshawJacob  Kahn M.D.   On: 06/13/2022 11:41    Procedures Procedures   Medications Ordered in ED Medications  iohexol (OMNIPAQUE) 350 MG/ML injection 75 mL ( Intravenous MAR Hold 06/13/22 1254)  0.9 %  sodium chloride infusion ( Intravenous Anesthesia Volume Adjustment 06/13/22 1424)  0.9 % irrigation (POUR BTL) (1,000 mLs Irrigation Given 06/13/22 1341)  hydrogen peroxide 3 % external solution (1 Application Other Given 06/13/22 1351)  bupivacaine liposome (EXPAREL) 1.3 % injection (20 mLs Infiltration Given 06/13/22 1423)  lidocaine (XYLOCAINE) 2 % viscous mouth solution (1 Application Mouth/Throat Given 06/13/22 1423)  fentaNYL (SUBLIMAZE) injection 25-50 mcg (50 mcg Intravenous Given 06/13/22 1520)  ondansetron (ZOFRAN) injection 4 mg (has no administration in time range)  HYDROcodone-acetaminophen (NORCO/VICODIN) 5-325  MG per tablet 1 tablet (1 tablet Oral Given 06/13/22 1029)  ketorolac (TORADOL) 15 MG/ML injection 15 mg (15 mg Intravenous Given 06/13/22 1113)  iohexol (OMNIPAQUE) 300 MG/ML solution 100 mL (100 mLs Intravenous Contrast Given 06/13/22 1122)  amoxicillin-clavulanate (AUGMENTIN) 875-125 MG per tablet 1 tablet (1 tablet Oral Given 06/13/22 1159)  chlorhexidine (HIBICLENS) 4 % liquid 1 Application (1 Application Topical Given 06/13/22 1243)  cefTRIAXone (ROCEPHIN) 2 g in sodium chloride 0.9 % 100 mL IVPB (2 g Intravenous New Bag/Given 06/13/22 1310)  chlorhexidine (PERIDEX) 0.12 % solution 15 mL (15 mLs Mouth/Throat Given 06/13/22 1305)    Or  Oral care mouth rinse ( Mouth Rinse See Alternative 06/13/22 1305)   oxyCODONE (Oxy IR/ROXICODONE) immediate release tablet 5 mg (5 mg Oral Given 06/13/22 1505)    Or  oxyCODONE (ROXICODONE) 5 MG/5ML solution 5 mg ( Oral See Alternative 06/13/22 1505)    ED Course/ Medical Decision Making/ A&P Clinical Course as of 06/13/22 1847  Mon Jun 13, 2022  1158 Patient CT scan shows evidence of perirectal abscess 3.4 x 1.2 cm, with associated rectal wall thickening and adjacent inflammatory stranding which is actually decreased in size from previous imaging in August 2017.  He was lost to follow-up and never saw surgery or had the area drained.  Spoke with Mardella Layman. Bridges of general surgery who will come to evaluate the patient provide further recommendations but she looked at previous imaging looks like it has decreased in size.  She suspect deeper perirectal abscess that is probably drained in the past and formed a fistula.  His labs are generally all unremarkable with slightly leukopenia 3.9.   [VB]  1231 D/w Dr bridges who will be taking patient to the OR for drainage of his perianal perirectal abscess.  She has ordered his preop antibiotics. [VB]    Clinical Course User Index [VB] Mardene Sayer, MD                           Medical Decision Making Nicholas Dunlap is a 41 y.o. male.  With PMH of high functioning CP, perianal abscess who presents with perirectal pain for the past 2 to 3 days.  Patient has some mild fluctuance and swelling to the left perianal region around 9-12 o clock.  There is no erythema, no active drainage, no laceration.  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  There are no hemorrhoids or lesions.  He is not septic.  Will obtain CT scan to further evaluate for underlying changes, fistulas or deeper abscess.   When he was last previously seen for this it was in 2017 and he was evaluated by surgery and referred for outpatient surgery evaluation and started on Augmentin and pain  control.  Amount and/or Complexity of Data Reviewed Labs: ordered. Radiology: ordered.  Risk Prescription drug management. Decision regarding hospitalization.   Final Clinical Impression(s) / ED Diagnoses Final diagnoses:  Perirectal abscess    Rx / DC Orders ED Discharge Orders          Ordered    oxyCODONE (ROXICODONE) 5 MG immediate release tablet  Every 4 hours PRN       Note to Pharmacy: Please call if prescription not available. Dr. Henreitta Leber- [phone]   06/13/22 1442    amoxicillin-clavulanate (AUGMENTIN) 875-125 MG tablet  2 times daily        06/13/22 1442    ondansetron (ZOFRAN)  4 MG tablet  Every 8 hours PRN        06/13/22 1442    docusate sodium (COLACE) 100 MG capsule  2 times daily        06/13/22 1442              Elgie Congo, MD 06/13/22 [phone]

## 2022-06-13 NOTE — Discharge Instructions (Addendum)
Post Operative Instructions after Perianal abscess, Fistula drainage  -Keep your stools soft and have a BM daily. Take fiber (Metamucil) over the counter daily. Take Colace twice daily if fiber does not keep your stools soft.  Take Miralax as needed if you still have not had a BM in 2 Days. -Take Sitz Baths (warm water baths) after every BM and when having discomfort.  -Take Ibuprofen and Tylenol alternating and the Narcotic pain medication for breakthrough pain. -Some bleeding is normal, but if you have excessive bleeding (filling up a pad in <1 hour for 2 hours), fevers, chills, or more pain than prior, call or go to the ED. -Wear a pad (ladies pads are cheapest) for drainage as needed.     Diet/ Activity: Diet as tolerated. You may not have an appetite, but it is important to stay hydrated. Drink 64 ounces of water a day. Your appetite will return with time.  Shower per your regular routine daily.  Do not take hot showers. Take warm showers that are less than 10 minutes. Rest and listen to your body, but do not remain in bed all day.  Walk everyday for at least 15-20 minutes. Deep cough and move around every 1-2 hours in the first few days after surgery.  Do not lift > 10 lbs, perform excessive bending, pushing, pulling, squatting for 1-2 weeks after surgery.   Pain Expectations and Narcotics: -After surgery you will have pain associated with your incisions and this is normal.  -You are encouraged and expected to take non narcotic medications like tylenol and ibuprofen (when able) to treat pain as multiple modalities can aid with pain treatment. -Narcotics are only used when pain is severe or there is breakthrough pain. -You are not expected to have a pain score of 0 after surgery, as we cannot prevent pain. A pain score of 3-4 that allows you to be functional, move, walk, and tolerate some activity is the goal. The pain will continue to improve over the days after surgery and is dependent on  your surgery. -Due to [place] law, we are only able to give a certain amount of pain medication to treat post operative pain, and we only give additional narcotics on a patient by patient basis.  -For most laparoscopic surgery, studies have shown that the majority of patients only need 10-15 narcotic pills, and for open surgeries most patients only need 15-20.   -Having appropriate expectations of pain and knowledge of pain management with non narcotics is important as we do not want anyone to become addicted to narcotic pain medication.  -Using ice packs in the first 48 hours and using over the counter medications are all ways to help with pain management.   -Simple acts like meditation and mindfulness practices after surgery can also help with pain control and research has proven the benefit of these practices.  Medication: Take tylenol and ibuprofen as needed for pain control, alternating every 4-6 hours.  Example:  Tylenol 1000mg  @ 6am, 12noon, 6pm, 41midnight (Do not exceed 4000mg  of tylenol a day). Ibuprofen 800mg  @ 9am, 3pm, 9pm, 3am (Do not exceed 3600mg  of ibuprofen a day).  Take Roxicodone for breakthrough pain every 4 hours.  Take Colace for constipation related to narcotic pain medication. If you do not have a bowel movement in 2 days, take Miralax over the counter.  Drink plenty of water to also prevent constipation.   Contact Information: If you have questions or concerns, please call our office, [phone], Monday-  Thursday 8AM-5PM and Friday 8AM-12Noon.  If it is after hours or on the weekend, please call [facility] Main Number, [phone], [phone], and ask to speak to the surgeon on call for Dr. Constance Haw at [facility].

## 2022-06-13 NOTE — ED Triage Notes (Signed)
Pt c/o lower abdominal pain since Friday. Hx of diverticulosis and concerned he might be having a "flare up". Denies n/v/d.

## 2022-06-13 NOTE — Anesthesia Procedure Notes (Signed)
Procedure Name: LMA Insertion Date/Time: 06/13/2022 1:20 PM  Performed by: Jonna Munro, CRNAPre-anesthesia Checklist: Patient identified, Emergency Drugs available, Suction available, Patient being monitored and Timeout performed Patient Re-evaluated:Patient Re-evaluated prior to induction Oxygen Delivery Method: Circle system utilized Preoxygenation: Pre-oxygenation with 100% oxygen Induction Type: IV induction LMA: LMA inserted LMA Size: 5.0 Number of attempts: 1 Placement Confirmation: positive ETCO2, CO2 detector and breath sounds checked- equal and bilateral Tube secured with: Tape Dental Injury: Teeth and Oropharynx as per pre-operative assessment

## 2022-06-13 NOTE — Transfer of Care (Signed)
Immediate Anesthesia Transfer of Care Note  Patient: Nicholas Dunlap  Procedure(s) Performed: IRRIGATION AND DEBRIDEMENT PERIRECTAL ABSCESS (Rectum)  Patient Location: PACU  Anesthesia Type:General  Level of Consciousness: awake, alert , drowsy and patient cooperative  Airway & Oxygen Therapy: Patient Spontanous Breathing  Post-op Assessment: Report given to RN, Post -op Vital signs reviewed and stable and Patient moving all extremities X 4  Post vital signs: Reviewed and stable  Last Vitals:  Vitals Value Taken Time  BP 137/78 06/13/22 1438  Temp    Pulse 73 06/13/22 1439  Resp 18 06/13/22 1440  SpO2 95 % 06/13/22 1439  Vitals shown include unvalidated device data.  Last Pain:  Vitals:   06/13/22 1257  TempSrc: Oral  PainSc: 3       Patients Stated Pain Goal: 6 (16/60/60 0459)  Complications: No notable events documented.

## 2022-06-13 NOTE — Progress Notes (Signed)
[facility]  Jesus Genera [phone], ride to pick patient up.   Curlene Labrum, MD [facility] [address] [place], [place] [address] [phone] (office)

## 2022-06-13 NOTE — Progress Notes (Addendum)
[facility]  Tried to update patient's mother that we drained the abscess and opened up the fistula. Recommend Sitz baths and antibiotics. Ride is Jesus Genera, the RN can call him when ready for dc. His mother is in the waiting room too.  Augmentin and pain meds to [facility] on [address]. Will see patient 9/26 for follow up.  Work note written for him to be out until next Tuesday, if he is feeling ok and wants to go back sooner he can call my office.   Curlene Labrum, MD [facility] [address] [place], [place] [address] [phone] (office)

## 2022-06-13 NOTE — Anesthesia Preprocedure Evaluation (Signed)
Anesthesia Evaluation  Patient identified by MRN, date of birth, ID band Patient awake    Reviewed: Allergy & Precautions, H&P , NPO status , Patient's Chart, lab work & pertinent test results, reviewed documented beta blocker date and time   Airway Mallampati: II  TM Distance: >3 FB Neck ROM: full    Dental no notable dental hx.    Pulmonary neg pulmonary ROS,    Pulmonary exam normal breath sounds clear to auscultation       Cardiovascular Exercise Tolerance: Good hypertension,  Rhythm:regular Rate:Normal     Neuro/Psych negative neurological ROS  negative psych ROS   GI/Hepatic negative GI ROS, Neg liver ROS,   Endo/Other  negative endocrine ROS  Renal/GU negative Renal ROS  negative genitourinary   Musculoskeletal   Abdominal   Peds  Hematology negative hematology ROS (+)   Anesthesia Other Findings   Reproductive/Obstetrics negative OB ROS                             Anesthesia Physical Anesthesia Plan  ASA: 3 and emergent  Anesthesia Plan: General   Post-op Pain Management:    Induction:   PONV Risk Score and Plan: Ondansetron  Airway Management Planned:   Additional Equipment:   Intra-op Plan:   Post-operative Plan:   Informed Consent: I have reviewed the patients History and Physical, chart, labs and discussed the procedure including the risks, benefits and alternatives for the proposed anesthesia with the patient or authorized representative who has indicated his/her understanding and acceptance.     Dental Advisory Given  Plan Discussed with: CRNA  Anesthesia Plan Comments:         Anesthesia Quick Evaluation

## 2022-06-13 NOTE — ED Notes (Signed)
Alert, NAD, calm, interactive, resps e/u. Attempted IV x3 unsuccessful.

## 2022-06-13 NOTE — Anesthesia Postprocedure Evaluation (Signed)
Anesthesia Post Note  Patient: Nicholas Dunlap  Procedure(s) Performed: IRRIGATION AND DEBRIDEMENT PERIRECTAL ABSCESS (Rectum)  Patient location during evaluation: Phase II Anesthesia Type: General Level of consciousness: awake Pain management: pain level controlled Vital Signs Assessment: post-procedure vital signs reviewed and stable Respiratory status: spontaneous breathing and respiratory function stable Cardiovascular status: blood pressure returned to baseline and stable Postop Assessment: no headache and no apparent nausea or vomiting Anesthetic complications: no Comments: Late entry   No notable events documented.   Last Vitals:  Vitals:   06/13/22 1257 06/13/22 1445  BP: (!) 138/99 (!) 140/86  Pulse: 65   Resp: 18 17  Temp: 37 C   SpO2: 100%     Last Pain:  Vitals:   06/13/22 1257  TempSrc: Oral  PainSc: [place]

## 2022-06-13 NOTE — Op Note (Signed)
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 Constance Haw, MD   Assistants: No qualified resident was available    Anesthesia: General endotracheal   Anesthesiologist: Louann Sjogren, MD    Specimens:  None   Estimated Blood Loss: Minimal   Blood Replacement: None    Complications: None   Wound Class: Infected   Operative Indications: [name] is a 41 yo with a recurrent abscess in the same location as prior. He came to the Ed with perianal pain. On CT and exam there was a perianal abscess that was quite tender. We discussed the options, and he opted for formal exploration drainage, and possible fistulotomy versus seton.   Findings: Superficial fistula with perianal abscess    Procedure: The patient was taken to the operating room and placed supine. General endotracheal anesthesia was induced. Intravenous antibiotics were administered per protocol.  He was then placed in lithotomy with all pressure points padded.  The perineal and perianal area were prepared and draped in the usual sterile fashion.   On external exam he had a large buttock that precluded getting easy access to his anal verge. He was repositioned on the bed to allow better access. There was obvious fluctuance in the posterior left anal region. I could feel a divot in the anal canal that concerned me for a fistula opening.  There were no masses noted. Peroxide was injected into the fluctuance and I could see some peroxide coming into the anal canal but this view was limited with the anoscope I had available. We tried several different ones. Ultimately a vaginal speculum as long enough to get adequate visualization. The fistula was probed with a hemostat and then a right angle and the tissue  was all superficial with no to minimal muscle being involved. The abscess cavity and the fistula were opened with cautery from the anal verge upward to the pit/ divot I had felt on digital exam. Purulence mixed with peroxide and blood was evacuated. Hemostasis was achieved. A gelfoam with lidocaine was placed and tagged with a silk suture. Exparel was injected around the area.   Final inspection revealed acceptable hemostasis. All counts were correct at the end of the case. The patient was awakened from anesthesia and extubated without complication.  The patient went to the PACU in stable condition.   Curlene Labrum, MD [facility] [address] [place], [place] [address] [phone] (office)

## 2022-06-13 NOTE — ED Notes (Signed)
Med rec tech at [facility], EKG done, pt undressed, last ate yesterday, belongings bagged and labeled, consent signed

## 2022-06-13 NOTE — H&P (Signed)
[facility] History and Physical  Reason for Referral: Perianal abscess  Referring Physician: ED Dr. Nechama Guard, MD     Nicholas Dunlap is a 41 y.o. male.  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 He had no drainage or fevers. He had a prior perianal abscess with antibiotics and no follow up in 2017. He has a new abscess on the left posterior aspect of the anus today on CT scan. He has not ate since yesterday.   Past Medical History:  Diagnosis Date   Cerebral palsy ([place])    CLOSED FRACTURE UNSPEC PHALANX/PHALANGES HAND 08/01/2007   Qualifier: Diagnosis of  By: Aline Brochure MD, [place]     Eczema    chest, buttock   Finger fracture, left 09/07/2016   fx./dislocation ring finger   Hyperlipidemia    Limp     Past Surgical History:  Procedure Laterality Date   EYE SURGERY Bilateral 1986   FINGER ARTHROPLASTY Left 09/13/2016   Procedure: LEFT RING FINGER VOLAR-PLATE REPAIR;  Surgeon: Milly Jakob, MD;  Location: [place];  Service: Orthopedics;  Laterality: Left;   FOOT CAPSULE RELEASE W/ PERCUTANEOUS HEEL CORD LENGTHENING, TIBIAL TENDON TRANSFER Left    as a child    History reviewed. No pertinent family history.  Social History   Tobacco Use   Smoking status: Never   Smokeless tobacco: Never  Vaping Use   Vaping Use: Never used  Substance Use Topics   Alcohol use: No   Drug use: No    Medications: I have reviewed the patient's current medications. Current Facility-Administered Medications  Medication Dose Route Frequency Provider Last Rate Last Admin   [START ON 06/14/2022] cefTRIAXone (ROCEPHIN) 2 g in sodium chloride 0.9 % 100 mL IVPB  2 g Intravenous On Call to OR Virl Cagey, MD       chlorhexidine (HIBICLENS) 4 % liquid 1 Application  1 Application Topical Once Virl Cagey, MD       iohexol (OMNIPAQUE) 350 MG/ML injection 75 mL  75 mL  Intravenous Once PRN Elgie Congo, MD       Current Outpatient Medications  Medication Sig Dispense Refill Last Dose   Cholecalciferol (D3 5000 PO) Take by mouth. Take 1daily   Past Week   hydrOXYzine (VISTARIL) 25 MG capsule Take 1 capsule (25 mg total) by mouth every 8 (eight) hours as needed. 30 capsule 0 unk   ketoconazole (NIZORAL) 2 % cream APPLY TOPICALLY TO THE AFFECTED AREA DAILY 15 g 0 Past Week   rosuvastatin (CRESTOR) 20 MG tablet Take 1 tablet (20 mg total) by mouth daily. 90 tablet 1 Past Week   telmisartan (MICARDIS) 20 MG tablet TAKE 1 TABLET(20 MG) BY MOUTH DAILY 30 tablet 2 Past Week   terbinafine (LAMISIL) 250 MG tablet Take 1 tablet (250 mg total) by mouth daily. 90 tablet 0 Past Week   triamcinolone cream (KENALOG) 0.1 % Apply 1 Application topically 2 (two) times daily. 45 g 0 Past Week    Allergies  Allergen Reactions   Milk-Related Compounds Shortness Of Breath   Other Shortness Of Breath and Tinitus    Dairy Products      ROS:  A comprehensive review of systems was negative except for: Gastrointestinal: positive for perianal pain, blood on toilet paper  Blood pressure (!) 148/96, pulse 77, temperature 98.2 F (36.8 C), temperature source Oral, resp. rate 16, height 5'  10" (1.778 m), weight 122.5 kg, SpO2 100 %. Physical Exam Vitals reviewed.  Constitutional:      Appearance: Normal appearance.  HENT:     Head: Normocephalic.     Nose: Nose normal.     Mouth/Throat:     Mouth: Mucous membranes are moist.  Eyes:     Extraocular Movements: Extraocular movements intact.  Cardiovascular:     Rate and Rhythm: Normal rate.  Pulmonary:     Effort: Pulmonary effort is normal.  Abdominal:     General: There is no distension.     Palpations: Abdomen is soft.     Tenderness: There is no abdominal tenderness.  Genitourinary:    Prostate: Tender.     Comments: Left posterior perianal area with fluctuance, tender, no drainage, difficult to examine   Musculoskeletal:        General: Normal range of motion.  Skin:    General: Skin is warm.  Neurological:     General: No focal deficit present.     Mental Status: He is alert and oriented to person, place, and time.  Psychiatric:        Mood and Affect: Mood normal.        Behavior: Behavior normal.     Results: Results for orders placed or performed during the hospital encounter of 06/13/22 (from the past 48 hour(s))  Urinalysis, Routine w reflex microscopic Urine, Clean Catch     Status: None   Collection Time: 06/13/22  9:05 AM  Result Value Ref Range   Color, Urine YELLOW YELLOW   APPearance CLEAR CLEAR   Specific Gravity, Urine 1.017 1.005 - 1.030   pH 5.0 5.0 - 8.0   Glucose, UA NEGATIVE NEGATIVE mg/dL   Hgb urine dipstick NEGATIVE NEGATIVE   Bilirubin Urine NEGATIVE NEGATIVE   Ketones, ur NEGATIVE NEGATIVE mg/dL   Protein, ur NEGATIVE NEGATIVE mg/dL   Nitrite NEGATIVE NEGATIVE   Leukocytes,Ua NEGATIVE NEGATIVE    Comment: Performed at [facility], [address] [place], [place] [address]  Lipase, blood     Status: None   Collection Time: 06/13/22  9:37 AM  Result Value Ref Range   Lipase 28 11 - 51 U/L    Comment: Performed at [facility], [address], [place], [place] [address]  Comprehensive metabolic panel     Status: None   Collection Time: 06/13/22  9:37 AM  Result Value Ref Range   Sodium 135 135 - 145 mmol/L   Potassium 4.5 3.5 - 5.1 mmol/L   Chloride 101 98 - 111 mmol/L   CO2 27 22 - 32 mmol/L   Glucose, Bld 98 70 - 99 mg/dL    Comment: Glucose reference range applies only to samples taken after fasting for at least 8 hours.   BUN 11 6 - 20 mg/dL   Creatinine, Ser 4.49 0.61 - 1.24 mg/dL   Calcium 9.1 8.9 - 67.5 mg/dL   Total Protein 7.7 6.5 - 8.1 g/dL   Albumin 3.9 3.5 - 5.0 g/dL   AST 19 15 - 41 U/L   ALT 29 0 - 44 U/L   Alkaline Phosphatase 59 38 - 126 U/L   Total Bilirubin 1.2 0.3 - 1.2 mg/dL   GFR, Estimated >91 >63 mL/min    Comment:  (NOTE) Calculated using the CKD-EPI Creatinine Equation (2021)    Anion gap 7 5 - 15    Comment: Performed at [facility], [address], [place], [place] [address]  CBC  Status: Abnormal   Collection Time: 06/13/22  9:37 AM  Result Value Ref Range   WBC 3.9 (L) 4.0 - 10.5 K/uL   RBC 6.03 (H) 4.22 - 5.81 MIL/uL   Hemoglobin 15.6 13.0 - 17.0 g/dL   HCT 74.0 81.4 - 48.1 %   MCV 80.8 80.0 - 100.0 fL   MCH 25.9 (L) 26.0 - 34.0 pg   MCHC 32.0 30.0 - 36.0 g/dL   RDW 85.6 31.4 - 97.0 %   Platelets 176 150 - 400 K/uL   nRBC 0.0 0.0 - 0.2 %    Comment: Performed at [facility], [address] [place], [place] [address]   Personally reviewed and reviewed CT from 2017- 2017 more horseshoe type abscess, today more left sided, 3.5cm in size, appears deeper on CT but based on exam is perianal  CT ABDOMEN PELVIS W CONTRAST  Result Date: 06/13/2022 CLINICAL DATA:  LLQ pain, nausea, h/o diverticulosis EXAM: CT ABDOMEN AND PELVIS WITH CONTRAST TECHNIQUE: Multidetector CT imaging of the abdomen and pelvis was performed using the standard protocol following bolus administration of intravenous contrast. RADIATION DOSE REDUCTION: This exam was performed according to the departmental dose-optimization program which includes automated exposure control, adjustment of the mA and/or kV according to patient size and/or use of iterative reconstruction technique. CONTRAST:  OMNIPAQUE IOHEXOL 300 MG/ML  SOLN COMPARISON:  CT 05/18/2016 FINDINGS: Lower chest: No acute abnormality. Hepatobiliary: No focal liver abnormality is seen. The gallbladder is unremarkable. Pancreas: Unremarkable. No pancreatic ductal dilatation or surrounding inflammatory changes. Spleen: Normal in size without focal abnormality. Adrenals/Urinary Tract: Adrenal glands are unremarkable. No hydronephrosis or nephrolithiasis. The bladder is mildly distended. There is a left lower pole renal cyst which appears simple in requires no follow-up  imaging. Stomach/Bowel: The stomach is within normal limits. There is no evidence of bowel obstruction.Appendix is normal. Sigmoid diverticulosis. There is rectal wall thickening with adjacent stranding, and a left-sided perianal abscess which measures 3.4 x 1.2 cm with punctate focus of gas (series 3, image 91). There is no evidence of ischiorectal fossa extension. The patient has a history of perianal abscess as seen on prior CT in August 2017. The abscess was more complex and larger, measuring up to 4.5 x 3.3 cm at that time. Vascular/Lymphatic: No significant vascular findings are present. No enlarged abdominal or pelvic lymph nodes. Reproductive: Unremarkable. Other: No abdominal wall hernia or abnormality. No abdominopelvic ascites. Musculoskeletal: No acute osseous abnormality. No suspicious osseous lesion. IMPRESSION: Perianal abscess measuring 3.4 x 1.2 cm, with associated rectal wall thickening and adjacent inflammatory stranding. Electronically Signed   By: Caprice Renshaw M.D.   On: 06/13/2022 11:41     Assessment & Plan:  RAFIK KOPPEL is a 41 y.o. male with perianal pain and abscess that is recurrent.  He is having pain and is tender and difficult to examine. Discussed options of antibiotic and follow up with plans for eventual look at the anal region for possible fistula at a later date versus drainage in the Ed and formal look later versus drainage today and possible fistulotomy versus seton.   -Discussed risk of bleeding, infection, injury to muscle, seton and needing the patient to have someone with him overnight. Discussed this with him and his mother who are in agreement. He has a ride that will come get him.  -EKG in ED  All questions were answered to the satisfaction of the patient and family.   Lucretia Roers 06/13/2022, 12:37 PM

## 2022-06-17 ENCOUNTER — Encounter (HOSPITAL_COMMUNITY): Payer: Self-pay | Admitting: General Surgery

## 2022-06-21 ENCOUNTER — Encounter: Payer: Self-pay | Admitting: General Surgery

## 2022-06-21 ENCOUNTER — Ambulatory Visit (INDEPENDENT_AMBULATORY_CARE_PROVIDER_SITE_OTHER): Payer: Medicare Other | Admitting: General Surgery

## 2022-06-21 ENCOUNTER — Encounter: Payer: Self-pay | Admitting: Family Medicine

## 2022-06-21 VITALS — BP 135/85 | HR 70 | Temp 99.2°F | Resp 12 | Ht 70.0 in | Wt 270.0 lb

## 2022-06-21 DIAGNOSIS — K610 Anal abscess: Secondary | ICD-10-CM

## 2022-06-21 NOTE — Progress Notes (Unsigned)
[facility]  I&D site draining. Sitz baths helping. He is trying to work but it is hard to work and stay cleaned up.   BP 135/85   Pulse 70   Temp 99.2 F (37.3 C) (Oral)   Resp 12   Ht 5\' 10"  (1.778 m)   Wt 270 lb (122.5 kg)   SpO2 98%   BMI 38.74 kg/m  Incision draining, flushed with saline, no erythema   Patient s/p I&D perianal abscess and fistulotomy.  Continue sitz baths multiple times a day.  Take one in the middle of the day after being at work for a few hours and when you get home for sure and at night before bed. Take tylenol and ibuprofen for pain. Take the narcotic for breakthrough pain, especially in the night time. Keep your stools soft, take stool softener like colace if needed.   Future Appointments  Date Time Provider [place]  06/24/2022  8:30 AM [facility] LAB [facility] None  07/01/2022  8:00 AM Harriett Rush, PA-C [facility] None  07/21/2022  1:15 PM Virl Cagey, MD RS-RS None  08/25/2022  2:00 PM Lindell Spar, MD [facility]  10/31/2022  3:00 PM [facility]-AWV [facility] [place], MD [facility] [address] [place], [place] [address] [phone] (office)

## 2022-06-21 NOTE — Patient Instructions (Signed)
Continue sitz baths multiple times a day.  Take one in the middle of the day after being at work for a few hours and when you get home for sure and at night before bed. Take tylenol and ibuprofen for pain. Take the narcotic for breakthrough pain, especially in the night time. Keep your stools soft, take stool softener like colace if needed.

## 2022-06-24 ENCOUNTER — Inpatient Hospital Stay: Payer: Medicare Other | Attending: Hematology

## 2022-06-24 DIAGNOSIS — D72819 Decreased white blood cell count, unspecified: Secondary | ICD-10-CM | POA: Insufficient documentation

## 2022-06-24 LAB — CBC WITH DIFFERENTIAL/PLATELET
Abs Immature Granulocytes: 0 10*3/uL (ref 0.00–0.07)
Basophils Absolute: 0 10*3/uL (ref 0.0–0.1)
Basophils Relative: 1 %
Eosinophils Absolute: 0.1 10*3/uL (ref 0.0–0.5)
Eosinophils Relative: 4 %
HCT: 44.4 % (ref 39.0–52.0)
Hemoglobin: 14.3 g/dL (ref 13.0–17.0)
Immature Granulocytes: 0 %
Lymphocytes Relative: 41 %
Lymphs Abs: 0.9 10*3/uL (ref 0.7–4.0)
MCH: 25.7 pg — ABNORMAL LOW (ref 26.0–34.0)
MCHC: 32.2 g/dL (ref 30.0–36.0)
MCV: 79.9 fL — ABNORMAL LOW (ref 80.0–100.0)
Monocytes Absolute: 0.2 10*3/uL (ref 0.1–1.0)
Monocytes Relative: 7 %
Neutro Abs: 1 10*3/uL — ABNORMAL LOW (ref 1.7–7.7)
Neutrophils Relative %: 47 %
Platelets: 188 10*3/uL (ref 150–400)
RBC: 5.56 MIL/uL (ref 4.22–5.81)
RDW: 13.1 % (ref 11.5–15.5)
WBC: 2.1 10*3/uL — ABNORMAL LOW (ref 4.0–10.5)
nRBC: 0 % (ref 0.0–0.2)

## 2022-06-24 LAB — SURGICAL PATHOLOGY

## 2022-06-24 LAB — LACTATE DEHYDROGENASE: LDH: 159 U/L (ref 98–192)

## 2022-06-30 NOTE — Progress Notes (Signed)
Nicholas Dunlap, [place] [id number]   CLINIC:  Medical Oncology/Hematology  PCP:  Lindell Spar, MD [address] [place] [address] [phone]   REASON FOR VISIT:  Follow-up for leukopenia  PRIOR THERAPY: None  CURRENT THERAPY: Surveillance  INTERVAL HISTORY:  Nicholas Dunlap 41 y.o. male returns for routine follow-up of his leukopenia.  He was last seen by Dr. Delton Coombes on 12/22/2021.  At today's visit, he reports feeling well.  He has some mild rectal soreness following removal of a rectal cyst 3 weeks ago, but denies any other recent surgeries or changes in baseline health status.  He denies any frequent infections or recent antibiotic use.  He denies any fever, chills, night sweats, unintentional weight loss.  He has not noticed any new lumps or bumps.  He has 100% energy and 100% appetite. He endorses that he is maintaining a stable weight.   REVIEW OF SYSTEMS: Review of Systems  Constitutional:  Negative for appetite change, chills, diaphoresis, fatigue, fever and unexpected weight change.  HENT:   Negative for lump/mass and nosebleeds.   Eyes:  Negative for eye problems.  Respiratory:  Negative for cough, hemoptysis and shortness of breath.   Cardiovascular:  Negative for chest pain, leg swelling and palpitations.  Gastrointestinal:  Negative for abdominal pain, blood in stool, constipation, diarrhea, nausea and vomiting.  Genitourinary:  Negative for hematuria.   Skin: Negative.   Neurological:  Negative for dizziness, headaches and light-headedness.  Hematological:  Does not bruise/bleed easily.      PAST MEDICAL/SURGICAL HISTORY:  Past Medical History:  Diagnosis Date   Cerebral palsy ([place])    CLOSED FRACTURE UNSPEC PHALANX/PHALANGES HAND 08/01/2007   Qualifier: Diagnosis of  By: Aline Brochure MD, [place]     Eczema    chest, buttock   Finger fracture, left 09/07/2016   fx./dislocation ring finger   Hyperlipidemia     Limp    Past Surgical History:  Procedure Laterality Date   EYE SURGERY Bilateral 1986   FINGER ARTHROPLASTY Left 09/13/2016   Procedure: LEFT RING FINGER VOLAR-PLATE REPAIR;  Surgeon: Milly Jakob, MD;  Location: [facility];  Service: Orthopedics;  Laterality: Left;   FOOT CAPSULE RELEASE W/ PERCUTANEOUS HEEL CORD LENGTHENING, TIBIAL TENDON TRANSFER Left    as a child   INCISION AND DRAINAGE PERIRECTAL ABSCESS N/A 06/13/2022   Procedure: IRRIGATION AND DEBRIDEMENT PERIRECTAL ABSCESS;  Surgeon: Virl Cagey, MD;  Location: [facility];  Service: General;  Laterality: N/A;     SOCIAL HISTORY:  Social History   Socioeconomic History   Marital status: Single    Spouse name: Not on file   Number of children: 0   Years of education: 12   Highest education level: Not on file  Occupational History   Not on file  Tobacco Use   Smoking status: Never   Smokeless tobacco: Never  Vaping Use   Vaping Use: Never used  Substance and Sexual Activity   Alcohol use: No   Drug use: No   Sexual activity: Not Currently  Other Topics Concern   Not on file  Social History Narrative   Not on file   Social Determinants of Health   Financial Resource Strain: Low Risk  (10/28/2021)   Overall Financial Resource Strain (CARDIA)    Difficulty of Paying Living Expenses: Not hard at all  Food Insecurity: No Food Insecurity (10/28/2021)   Hunger Vital Sign    Worried About Running  Out of Food in the Last Year: Never true    Ran Out of Food in the Last Year: Never true  Transportation Needs: No Transportation Needs (10/28/2021)   PRAPARE - Hydrologist (Medical): No    Lack of Transportation (Non-Medical): No  Physical Activity: Sufficiently Active (10/28/2021)   Exercise Vital Sign    Days of Exercise per Week: 3 days    Minutes of Exercise per Session: 60 min  Stress: No Stress Concern Present (10/28/2021)   [place]    Feeling of Stress : Not at all  Social Connections: Moderately Isolated (10/28/2021)   Social Connection and Isolation Panel [NHANES]    Frequency of Communication with Friends and Family: Once a week    Frequency of Social Gatherings with Friends and Family: Once a week    Attends Religious Services: More than 4 times per year    Active Member of [employer] or Organizations: Yes    Attends Archivist Meetings: More than 4 times per year    Marital Status: Never married  Intimate Partner Violence: Not At Risk (10/28/2021)   Humiliation, Afraid, Rape, and Kick questionnaire    Fear of Current or Ex-Partner: No    Emotionally Abused: No    Physically Abused: No    Sexually Abused: No    FAMILY HISTORY:  No family history on file.  CURRENT MEDICATIONS:  Outpatient Encounter Medications as of 07/01/2022  Medication Sig   Cholecalciferol (D3 5000 PO) Take by mouth. Take 1daily   docusate sodium (COLACE) 100 MG capsule Take 1 capsule (100 mg total) by mouth 2 (two) times daily.   hydrOXYzine (VISTARIL) 25 MG capsule Take 1 capsule (25 mg total) by mouth every 8 (eight) hours as needed.   ketoconazole (NIZORAL) 2 % cream APPLY TOPICALLY TO THE AFFECTED AREA DAILY   ondansetron (ZOFRAN) 4 MG tablet Take 1 tablet (4 mg total) by mouth every 8 (eight) hours as needed.   oxyCODONE (ROXICODONE) 5 MG immediate release tablet Take 1 tablet (5 mg total) by mouth every 4 (four) hours as needed for breakthrough pain or severe pain.   rosuvastatin (CRESTOR) 20 MG tablet Take 1 tablet (20 mg total) by mouth daily.   telmisartan (MICARDIS) 20 MG tablet TAKE 1 TABLET(20 MG) BY MOUTH DAILY   terbinafine (LAMISIL) 250 MG tablet Take 1 tablet (250 mg total) by mouth daily.   triamcinolone cream (KENALOG) 0.1 % Apply 1 Application topically 2 (two) times daily.   No facility-administered encounter medications on file as of 07/01/2022.    ALLERGIES:  Allergies   Allergen Reactions   Milk-Related Compounds Shortness Of Breath   Other Shortness Of Breath and Tinitus    Dairy Products     PHYSICAL EXAM: ECOG PERFORMANCE STATUS: 0 - Asymptomatic  There were no vitals filed for this visit. There were no vitals filed for this visit. Physical Exam Constitutional:      Appearance: Normal appearance. He is obese.  HENT:     Head: Normocephalic and atraumatic.     Mouth/Throat:     Mouth: Mucous membranes are moist.  Eyes:     Extraocular Movements: Extraocular movements intact.     Pupils: Pupils are equal, round, and reactive to light.  Cardiovascular:     Rate and Rhythm: Normal rate and regular rhythm.     Pulses: Normal pulses.     Heart sounds: Normal heart sounds.  Pulmonary:     Effort: Pulmonary effort is normal.     Breath sounds: Normal breath sounds.  Abdominal:     General: Bowel sounds are normal.     Palpations: Abdomen is soft.     Tenderness: There is no abdominal tenderness.  Musculoskeletal:        General: No swelling.     Right lower leg: No edema.     Left lower leg: No edema.  Lymphadenopathy:     Cervical: No cervical adenopathy.  Skin:    General: Skin is warm and dry.  Neurological:     General: No focal deficit present.     Mental Status: He is alert and oriented to person, place, and time.  Psychiatric:        Mood and Affect: Mood normal.        Behavior: Behavior normal.      LABORATORY DATA:  I have reviewed the labs as listed.  CBC    Component Value Date/Time   WBC 2.1 (L) 06/24/2022 0822   RBC 5.56 06/24/2022 0822   HGB 14.3 06/24/2022 0822   HGB 15.9 11/03/2021 0836   HCT 44.4 06/24/2022 0822   HCT 48.9 11/03/2021 0836   PLT 188 06/24/2022 0822   PLT 176 11/03/2021 0836   MCV 79.9 (L) 06/24/2022 0822   MCV 79 11/03/2021 0836   MCH 25.7 (L) 06/24/2022 0822   MCHC 32.2 06/24/2022 0822   RDW 13.1 06/24/2022 0822   RDW 13.0 11/03/2021 0836   LYMPHSABS 0.9 06/24/2022 0822    LYMPHSABS 1.1 11/03/2021 0836   MONOABS 0.2 06/24/2022 0822   EOSABS 0.1 06/24/2022 0822   EOSABS 0.0 11/03/2021 0836   BASOSABS 0.0 06/24/2022 0822   BASOSABS 0.0 11/03/2021 0836      Latest Ref Rng & Units 06/13/2022    9:37 AM 03/16/2022    3:53 PM 11/03/2021    8:36 AM  CMP  Glucose 70 - 99 mg/dL 98  88  79   BUN 6 - 20 mg/dL 11  12  11    Creatinine 0.61 - 1.24 mg/dL 0.95  1.06  1.23   Sodium 135 - 145 mmol/L 135  141  139   Potassium 3.5 - 5.1 mmol/L 4.5  4.5  4.6   Chloride 98 - 111 mmol/L 101  105  101   CO2 22 - 32 mmol/L 27  23  24    Calcium 8.9 - 10.3 mg/dL 9.1  9.0  9.8   Total Protein 6.5 - 8.1 g/dL 7.7  6.8    Total Bilirubin 0.3 - 1.2 mg/dL 1.2  0.9    Alkaline Phos 38 - 126 U/L 59  93    AST 15 - 41 U/L 19  30    ALT 0 - 44 U/L 29  30      DIAGNOSTIC IMAGING:  I have independently reviewed the relevant imaging and discussed with the patient.  ASSESSMENT & PLAN: 1.  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  Baseline WBC around 3.0 ([place] around 1.5), but did have normal WBC on two occasions in 2017.  No anemia or thrombocytopenia. - No new medications/OTC supplements, recent antibiotics or infections, or tick bites. - Tested negative for HIV and hepatitis C in October 2022. - Hematology work-up (11/22/2021):  ANA and rheumatoid factor, hepatitis B serology was negative.  Nutritional deficiency work-up was also negative.  LDH was normal. - Flow cytometry (06/24/2022) negative for any monoclonal B-cell  population or T-cell abnormalities - Denies any fevers, chills, night sweats, rashes, or weight loss. - Denies any recent infections.   - Most recent CBC (06/24/2022): WBC 2.1 with ANC 1.0 - PLAN: Differential diagnosis includes constitutional neutropenia (formerly called benign ethnic neutropenia).  Discussed with patient that this may be "normal for him." - We discussed that work-up so far has been normal, and that there is no strong indication for  bone marrow biopsy at this time since he does not have any B symptoms or recurrent infections. - We will check CBC only in 6 months, with same-day labs and office visit in 1 year.  2.  Social/family history: - He works as a Retail buyer.  No chemical exposure.  Non-smoker. - No family history of leukopenia.  Paternal grandmother had leukemia.  Mother had colon cancer.   All questions were answered. The patient knows to call the clinic with any problems, questions or concerns.  Medical decision making: Low  Time spent on visit: I spent 15 minutes counseling the patient face to face. The total time spent in the appointment was 22 minutes and more than 50% was on counseling.   Harriett Rush, PA-C  07/01/2022 8:39 AM

## 2022-07-01 ENCOUNTER — Inpatient Hospital Stay: Payer: Medicare Other | Attending: Physician Assistant | Admitting: Physician Assistant

## 2022-07-01 VITALS — BP 130/90 | HR 80 | Temp 97.7°F | Resp 18 | Ht 70.0 in | Wt 267.5 lb

## 2022-07-01 DIAGNOSIS — D709 Neutropenia, unspecified: Secondary | ICD-10-CM | POA: Diagnosis present

## 2022-07-01 DIAGNOSIS — Z79899 Other long term (current) drug therapy: Secondary | ICD-10-CM | POA: Diagnosis not present

## 2022-07-01 LAB — FLOW CYTOMETRY

## 2022-07-01 NOTE — Patient Instructions (Signed)
[facility] at [place] **   You were seen today by Tarri Abernethy PA-C for your low white blood cells.   Your white blood cells remain mildly low but are stable. We have not found any obvious cause of your low white blood cells and suspect that this may be a normal variant.  (Some people naturally have lower white blood cells than other people, which does not cause any problems and is just they are normal.) We will recheck your blood counts in 6 months and then see you for labs and an office visit in 1 year.  FOLLOW-UP APPOINTMENT: Office visit in 1 year  ** Thank you for trusting me with your healthcare!  I strive to provide all of my patients with quality care at each visit.  If you receive a survey for this visit, I would be so grateful to you for taking the time to provide feedback.  Thank you in advance!  ~ Joselin Crandell                   Dr. Derek Jack   &   Tarri Abernethy, PA-C   - - - - - - - - - - - - - - - - - -    Thank you for choosing [place] at [facility] to provide your oncology and hematology care.  To afford each patient quality time with our provider, please arrive at least 15 minutes before your scheduled appointment time.   If you have a lab appointment with the [facility] please come in thru the Main Entrance and check in at the main information desk.  You need to re-schedule your appointment should you arrive 10 or more minutes late.  We strive to give you quality time with our providers, and arriving late affects you and other patients whose appointments are after yours.  Also, if you no show three or more times for appointments you may be dismissed from the clinic at the providers discretion.     Again, thank you for choosing [facility].  Our hope is that these requests will decrease the amount of time that you wait before being seen by our  physicians.       _____________________________________________________________  Should you have questions after your visit to [facility], please contact our office at [phone] and follow the prompts.  Our office hours are 8:00 a.m. and 4:30 p.m. Monday - Friday.  Please note that voicemails left after 4:00 p.m. may not be returned until the following business day.  We are closed weekends and major holidays.  You do have access to a nurse 24-7, just call the main number to the clinic [phone] and do not press any options, hold on the line and a nurse will answer the phone.    For prescription refill requests, have your pharmacy contact our office and allow 72 hours.

## 2022-07-07 ENCOUNTER — Other Ambulatory Visit: Payer: Self-pay | Admitting: Family Medicine

## 2022-07-21 ENCOUNTER — Ambulatory Visit (INDEPENDENT_AMBULATORY_CARE_PROVIDER_SITE_OTHER): Payer: Medicare Other | Admitting: General Surgery

## 2022-07-21 ENCOUNTER — Encounter: Payer: Self-pay | Admitting: General Surgery

## 2022-07-21 VITALS — BP 124/81 | HR 73 | Temp 98.5°F | Resp 16 | Ht 70.0 in | Wt 269.0 lb

## 2022-07-21 DIAGNOSIS — K610 Anal abscess: Secondary | ICD-10-CM

## 2022-07-21 NOTE — Patient Instructions (Signed)
Can come off the stool softeners if the stools are too loose. Try fiber (metamucil/ benefiber over the counter) as needed. Keep stools regular.

## 2022-07-23 ENCOUNTER — Other Ambulatory Visit: Payer: Self-pay | Admitting: Internal Medicine

## 2022-07-25 NOTE — Progress Notes (Signed)
[facility]  Doing well. No major issues. Having regular Bms. No pain.   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 Doing well. Having some looser stools he reports but still on colace.  Can come off the stool softeners if the stools are too loose. Try fiber (metamucil/ benefiber over the counter) as needed. Keep stools regular.   Curlene Labrum, MD [facility] [address] [place], [place] [address] [phone] (office)

## 2022-07-26 ENCOUNTER — Other Ambulatory Visit: Payer: Self-pay | Admitting: Internal Medicine

## 2022-07-26 DIAGNOSIS — I10 Essential (primary) hypertension: Secondary | ICD-10-CM

## 2022-08-17 ENCOUNTER — Ambulatory Visit: Payer: Medicare Other | Admitting: Internal Medicine

## 2022-08-25 ENCOUNTER — Ambulatory Visit (INDEPENDENT_AMBULATORY_CARE_PROVIDER_SITE_OTHER): Payer: Medicare Other | Admitting: Internal Medicine

## 2022-08-25 ENCOUNTER — Encounter: Payer: Self-pay | Admitting: Internal Medicine

## 2022-08-25 VITALS — BP 132/85 | HR 83 | Ht 70.0 in | Wt 278.4 lb

## 2022-08-25 DIAGNOSIS — D72819 Decreased white blood cell count, unspecified: Secondary | ICD-10-CM

## 2022-08-25 DIAGNOSIS — B351 Tinea unguium: Secondary | ICD-10-CM

## 2022-08-25 DIAGNOSIS — Z2821 Immunization not carried out because of patient refusal: Secondary | ICD-10-CM

## 2022-08-25 DIAGNOSIS — B353 Tinea pedis: Secondary | ICD-10-CM | POA: Diagnosis not present

## 2022-08-25 DIAGNOSIS — L309 Dermatitis, unspecified: Secondary | ICD-10-CM

## 2022-08-25 DIAGNOSIS — E782 Mixed hyperlipidemia: Secondary | ICD-10-CM

## 2022-08-25 DIAGNOSIS — I10 Essential (primary) hypertension: Secondary | ICD-10-CM | POA: Diagnosis not present

## 2022-08-25 MED ORDER — ITRACONAZOLE 100 MG PO CAPS: 100.0000 mg | ORAL_CAPSULE | Freq: Every day | ORAL | 0 refills | Status: DC

## 2022-08-25 MED ORDER — CLOBETASOL PROPIONATE 0.05 % EX CREA: 1.0000 | TOPICAL_CREAM | Freq: Two times a day (BID) | CUTANEOUS | 0 refills | Status: DC

## 2022-08-25 NOTE — Assessment & Plan Note (Addendum)
On Crestor Check lipid profile 

## 2022-08-25 NOTE — Assessment & Plan Note (Signed)
BP Readings from Last 1 Encounters:  08/25/22 132/85   Well-controlled with telmisartan 20 mg daily Advised DASH diet and moderate exercise/walking, at least 150 mins/week

## 2022-08-25 NOTE — Patient Instructions (Addendum)
Please take Itraconazole as prescribed.  Please apply Clobetasol cream for rash on foot.  Please continue taking medications as prescribed.  Please continue to follow low salt diet and perform moderate exercise/walking at least 150 mins/week.  Please get fasting blood tests done before the next visit.

## 2022-08-25 NOTE — Assessment & Plan Note (Signed)
Rash on right foot likely due to allergic dermatitis/eczema He has tried Kenalog cream with some improvement Switched to clobetasol cream

## 2022-08-25 NOTE — Assessment & Plan Note (Addendum)
CBC reviewed - showed leukopenia Had Heme/Onc. eval - plan to check CBC after 6 months

## 2022-08-25 NOTE — Assessment & Plan Note (Signed)
Completed oral terbinafine Still has discolored toenails on left foot, started oral itraconazole

## 2022-08-25 NOTE — Assessment & Plan Note (Signed)
Has noticed improvement with ketoconazole cream and oral terbinafine Still has spots of blackish discoloration Started itraconazole 100 mg daily for 2 weeks

## 2022-08-25 NOTE — Progress Notes (Signed)
Established Patient Office Visit  Subjective:  Patient ID: Nicholas Dunlap, male    DOB: June 21, 1981  Age: 41 y.o. MRN: [id number]  CC:  Chief Complaint  Patient presents with   Follow-up    Follow up recheck rash on L foot.    HPI Nicholas Dunlap is a 41 y.o. male with past medical history of cerebral palsy, HTN and HLD who presents for f/u of his chronic medical conditions.  HTN: BP is well-controlled now. Takes medications regularly. Patient denies headache, dizziness, chest pain, dyspnea or palpitations.  He was evaluated by Heme/Onc. for leukopenia, but his WBC counts were deemed to be stable. He denies any fever, chills, night sweats or unintentional weight loss.  He had a rash on the feet.  He was given terbinafine for onychomycosis and ketoconazole cream for tinea pedis.  He reports improvement in the rash of left foot, but still has spots of blackish discoloration.  He also has thickening of the skin on the right foot.   Past Medical History:  Diagnosis Date   Cerebral palsy ([place])    CLOSED FRACTURE UNSPEC PHALANX/PHALANGES HAND 08/01/2007   Qualifier: Diagnosis of  By: Aline Brochure MD, [place]     Eczema    chest, buttock   Finger fracture, left 09/07/2016   fx./dislocation ring finger   Hyperlipidemia    Limp     Past Surgical History:  Procedure Laterality Date   EYE SURGERY Bilateral 1986   FINGER ARTHROPLASTY Left 09/13/2016   Procedure: LEFT RING FINGER VOLAR-PLATE REPAIR;  Surgeon: Milly Jakob, MD;  Location: [facility];  Service: Orthopedics;  Laterality: Left;   FOOT CAPSULE RELEASE W/ PERCUTANEOUS HEEL CORD LENGTHENING, TIBIAL TENDON TRANSFER Left    as a child   INCISION AND DRAINAGE PERIRECTAL ABSCESS N/A 06/13/2022   Procedure: IRRIGATION AND DEBRIDEMENT PERIRECTAL ABSCESS;  Surgeon: Virl Cagey, MD;  Location: [facility];  Service: General;  Laterality: N/A;    History reviewed. No pertinent family history.  Social History    Socioeconomic History   Marital status: Single    Spouse name: Not on file   Number of children: 0   Years of education: 12   Highest education level: Not on file  Occupational History   Not on file  Tobacco Use   Smoking status: Never   Smokeless tobacco: Never  Vaping Use   Vaping Use: Never used  Substance and Sexual Activity   Alcohol use: No   Drug use: No   Sexual activity: Not Currently  Other Topics Concern   Not on file  Social History Narrative   Not on file   Social Determinants of Health   Financial Resource Strain: Low Risk  (10/28/2021)   Overall Financial Resource Strain (CARDIA)    Difficulty of Paying Living Expenses: Not hard at all  Food Insecurity: No Food Insecurity (10/28/2021)   Hunger Vital Sign    Worried About Running Out of Food in the Last Year: Never true    [place] in the Last Year: Never true  Transportation Needs: No Transportation Needs (10/28/2021)   PRAPARE - Hydrologist (Medical): No    Lack of Transportation (Non-Medical): No  Physical Activity: Sufficiently Active (10/28/2021)   Exercise Vital Sign    Days of Exercise per Week: 3 days    Minutes of Exercise per Session: 60 min  Stress: No Stress Concern Present (10/28/2021)   [employer] - Occupational Stress Questionnaire    Feeling of Stress : Not at all  Social Connections: Moderately Isolated (10/28/2021)   Social Connection and Isolation Panel [NHANES]    Frequency of Communication with Friends and Family: Once a week    Frequency of Social Gatherings with Friends and Family: Once a week    Attends Religious Services: More than 4 times per year    Active Member of [employer] or Organizations: Yes    Attends Archivist Meetings: More than 4 times per year    Marital Status: Never married  Intimate Partner Violence: Not At Risk (10/28/2021)   Humiliation, Afraid, Rape, and Kick questionnaire    Fear of Current or  Ex-Partner: No    Emotionally Abused: No    Physically Abused: No    Sexually Abused: No    Outpatient Medications Prior to Visit  Medication Sig Dispense Refill   Cholecalciferol (D3 5000 PO) Take by mouth. Take 1daily     docusate sodium (COLACE) 100 MG capsule Take 1 capsule (100 mg total) by mouth 2 (two) times daily. 60 capsule 2   hydrOXYzine (VISTARIL) 25 MG capsule Take 1 capsule (25 mg total) by mouth every 8 (eight) hours as needed. 30 capsule 0   rosuvastatin (CRESTOR) 20 MG tablet Take 1 tablet (20 mg total) by mouth daily. 90 tablet 1   telmisartan (MICARDIS) 20 MG tablet TAKE 1 TABLET(20 MG) BY MOUTH DAILY 30 tablet 2   ketoconazole (NIZORAL) 2 % cream APPLY TOPICALLY TO THE AFFECTED AREA DAILY 15 g 0   terbinafine (LAMISIL) 250 MG tablet Take 1 tablet (250 mg total) by mouth daily. 90 tablet 0   triamcinolone cream (KENALOG) 0.1 % Apply 1 Application topically 2 (two) times daily. 45 g 0   ondansetron (ZOFRAN) 4 MG tablet Take 1 tablet (4 mg total) by mouth every 8 (eight) hours as needed. (Patient not taking: Reported on 07/01/2022) 30 tablet 1   No facility-administered medications prior to visit.    Allergies  Allergen Reactions   Milk-Related Compounds Shortness Of Breath   Other Shortness Of Breath and Tinitus    Dairy Products    ROS Review of Systems  Constitutional:  Negative for chills and fever.  HENT:  Negative for congestion and sore throat.   Eyes:  Negative for pain and discharge.  Respiratory:  Negative for cough and shortness of breath.   Cardiovascular:  Negative for chest pain and palpitations.  Gastrointestinal:  Negative for constipation, diarrhea, nausea and vomiting.  Endocrine: Negative for polydipsia and polyuria.  Genitourinary:  Negative for dysuria and hematuria.  Musculoskeletal:  Negative for neck pain and neck stiffness.  Skin:  Positive for rash.  Neurological:  Negative for dizziness, weakness, numbness and headaches.   Psychiatric/Behavioral:  Negative for agitation and behavioral problems.       Objective:    Physical Exam Vitals reviewed.  Constitutional:      General: He is not in acute distress.    Appearance: He is not diaphoretic.  HENT:     Head: Normocephalic and atraumatic.     Nose: Nose normal.     Mouth/Throat:     Mouth: Mucous membranes are moist.  Eyes:     General: No scleral icterus.    Extraocular Movements: Extraocular movements intact.     Comments: B/l strabismus  Cardiovascular:     Rate and Rhythm: Normal rate and regular rhythm.     Pulses: Normal pulses.  Heart sounds: Normal heart sounds. No murmur heard. Pulmonary:     Breath sounds: Normal breath sounds. No wheezing or rales.  Musculoskeletal:     Cervical back: Neck supple. No tenderness.     Right lower leg: No edema.     Left lower leg: No edema.  Feet:     Left foot:     Toenail Condition: Left toenails are abnormally thick. Fungal disease present. Skin:    General: Skin is warm.     Findings: No rash.  Neurological:     General: No focal deficit present.     Mental Status: He is alert and oriented to person, place, and time.     Sensory: No sensory deficit.     Motor: No weakness.  Psychiatric:        Mood and Affect: Mood normal.        Behavior: Behavior normal.     BP 132/85 (BP Location: Left Arm, Patient Position: Sitting, Cuff Size: Large)   Pulse 83   Ht _0  (1.778 m)   Wt 278 lb 6.4 oz (126.3 kg)   SpO2 96%   BMI 39.95 kg/m  Wt Readings from Last 3 Encounters:  08/25/22 278 lb 6.4 oz (126.3 kg)  07/21/22 269 lb (122 kg)  07/01/22 267 lb 8 oz (121.3 kg)    No results found for: "TSH" Lab Results  Component Value Date   WBC 2.1 (L) 06/24/2022   HGB 14.3 06/24/2022   HCT 44.4 06/24/2022   MCV 79.9 (L) 06/24/2022   PLT 188 06/24/2022   Lab Results  Component Value Date   NA 135 06/13/2022   K 4.5 06/13/2022   CO2 27 06/13/2022   GLUCOSE 98 06/13/2022   BUN 11  06/13/2022   CREATININE 0.95 06/13/2022   BILITOT 1.2 06/13/2022   ALKPHOS 59 06/13/2022   AST 19 06/13/2022   ALT 29 06/13/2022   PROT 7.7 06/13/2022   ALBUMIN 3.9 06/13/2022   CALCIUM 9.1 06/13/2022   ANIONGAP 7 06/13/2022   EGFR 90 03/16/2022   Lab Results  Component Value Date   CHOL 157 07/20/2021   Lab Results  Component Value Date   HDL 74 07/20/2021   Lab Results  Component Value Date   LDLCALC 75 07/20/2021   Lab Results  Component Value Date   TRIG 33 07/20/2021   Lab Results  Component Value Date   CHOLHDL 2.1 07/20/2021   No results found for: "HGBA1C"    Assessment & Plan:   Problem List Items Addressed This Visit       Cardiovascular and Mediastinum   Essential hypertension - Primary    BP Readings from Last 1 Encounters:  08/25/22 132/85  Well-controlled with telmisartan 20 mg daily Advised DASH diet and moderate exercise/walking, at least 150 mins/week      Relevant Orders   CMP14+EGFR     Musculoskeletal and Integument   Tinea pedis    Has noticed improvement with ketoconazole cream and oral terbinafine Still has spots of blackish discoloration Started itraconazole 100 mg daily for 2 weeks      Relevant Medications   itraconazole (SPORANOX) 100 MG capsule   Onychomycosis    Completed oral terbinafine Still has discolored toenails on left foot, started oral itraconazole      Relevant Medications   itraconazole (SPORANOX) 100 MG capsule   Eczema    Rash on right foot likely due to allergic dermatitis/eczema He has tried Kenalog cream with some  improvement Switched to clobetasol cream      Relevant Medications   clobetasol cream (TEMOVATE) 0.05 %     Other   Mixed hyperlipidemia    On Crestor Check lipid profile      Relevant Orders   CMP14+EGFR   Lipid Profile   Leukopenia    CBC reviewed - showed leukopenia Had Heme/Onc. eval - plan to check CBC and flow cytometry after 6 months      Other Visit Diagnoses      Influenza vaccination declined          Meds ordered this encounter  Medications   DISCONTD: itraconazole (SPORANOX) 100 MG capsule    Sig: Take 1 capsule (100 mg total) by mouth daily.    Dispense:  14 capsule    Refill:  0   clobetasol cream (TEMOVATE) 0.05 %    Sig: Apply 1 Application topically 2 (two) times daily.    Dispense:  60 g    Refill:  0   itraconazole (SPORANOX) 100 MG capsule    Sig: Take 1 capsule (100 mg total) by mouth daily.    Dispense:  14 capsule    Refill:  0    Follow-up: Return in about 6 months (around 02/23/2023) for HTN and HLD.    Lindell Spar, MD

## 2022-10-08 ENCOUNTER — Other Ambulatory Visit: Payer: Self-pay | Admitting: Internal Medicine

## 2022-10-08 DIAGNOSIS — E782 Mixed hyperlipidemia: Secondary | ICD-10-CM

## 2022-10-22 ENCOUNTER — Other Ambulatory Visit: Payer: Self-pay | Admitting: Internal Medicine

## 2022-10-22 DIAGNOSIS — I10 Essential (primary) hypertension: Secondary | ICD-10-CM

## 2022-11-17 ENCOUNTER — Ambulatory Visit (INDEPENDENT_AMBULATORY_CARE_PROVIDER_SITE_OTHER): Payer: Medicare Other

## 2022-11-17 DIAGNOSIS — Z0000 Encounter for general adult medical examination without abnormal findings: Secondary | ICD-10-CM | POA: Diagnosis not present

## 2022-11-17 NOTE — Progress Notes (Signed)
Subjective:   Nicholas Dunlap is a 42 y.o. male who presents for Medicare Annual/Subsequent preventive examination.  Review of Systems    I connected with  Nicholas Dunlap on 11/17/22 by a audio enabled telemedicine application and verified that I am speaking with the correct person using two identifiers.  Patient Location: Home  Provider Location: Office/Clinic  I discussed the limitations of evaluation and management by telemedicine. The patient expressed understanding and agreed to proceed.        Objective:    There were no vitals filed for this visit. There is no height or weight on file to calculate BMI.     07/01/2022    8:13 AM 06/13/2022    8:53 AM 12/22/2021    3:29 PM 10/28/2021    4:29 PM 10/07/2018    9:53 AM 09/17/2017    8:31 PM 09/13/2016    1:12 PM  Advanced Directives  Does Patient Have a Medical Advance Directive? No No No No No No No  Would patient like information on creating a medical advance directive? No - Patient declined No - Patient declined No - Patient declined No - Patient declined  No - Patient declined     Current Medications (verified) Outpatient Encounter Medications as of 11/17/2022  Medication Sig   Cholecalciferol (D3 5000 PO) Take by mouth. Take 1daily   clobetasol cream (TEMOVATE) [id number] % Apply 1 Application topically 2 (two) times daily.   docusate sodium (COLACE) 100 MG capsule Take 1 capsule (100 mg total) by mouth 2 (two) times daily.   hydrOXYzine (VISTARIL) 25 MG capsule Take 1 capsule (25 mg total) by mouth every 8 (eight) hours as needed.   itraconazole (SPORANOX) 100 MG capsule Take 1 capsule (100 mg total) by mouth daily.   ondansetron (ZOFRAN) 4 MG tablet Take 1 tablet (4 mg total) by mouth every 8 (eight) hours as needed. (Patient not taking: Reported on 07/01/2022)   rosuvastatin (CRESTOR) 20 MG tablet TAKE 1 TABLET(20 MG) BY MOUTH DAILY   telmisartan (MICARDIS) 20 MG tablet TAKE 1 TABLET(20 MG) BY MOUTH DAILY   No  facility-administered encounter medications on file as of 11/17/2022.    Allergies (verified) Milk-related compounds and Other   History: Past Medical History:  Diagnosis Date   Cerebral palsy ([place])    CLOSED FRACTURE UNSPEC PHALANX/PHALANGES HAND 08/01/2007   Qualifier: Diagnosis of  By: Aline Brochure MD, [place]     Eczema    chest, buttock   Finger fracture, left 09/07/2016   fx./dislocation ring finger   Hyperlipidemia    Limp    Past Surgical History:  Procedure Laterality Date   EYE SURGERY Bilateral 1986   FINGER ARTHROPLASTY Left 09/13/2016   Procedure: LEFT RING FINGER VOLAR-PLATE REPAIR;  Surgeon: Milly Jakob, MD;  Location: [facility];  Service: Orthopedics;  Laterality: Left;   FOOT CAPSULE RELEASE W/ PERCUTANEOUS HEEL CORD LENGTHENING, TIBIAL TENDON TRANSFER Left    as a child   INCISION AND DRAINAGE PERIRECTAL ABSCESS N/A 06/13/2022   Procedure: IRRIGATION AND DEBRIDEMENT PERIRECTAL ABSCESS;  Surgeon: Virl Cagey, MD;  Location: [facility];  Service: General;  Laterality: N/A;   No family history on file. Social History   Socioeconomic History   Marital status: Single    Spouse name: Not on file   Number of children: 0   Years of education: 12   Highest education level: Not on file  Occupational History   Not on file  Tobacco Use  Smoking status: Never   Smokeless tobacco: Never  Vaping Use   Vaping Use: Never used  Substance and Sexual Activity   Alcohol use: No   Drug use: No   Sexual activity: Not Currently  Other Topics Concern   Not on file  Social History Narrative   Not on file   Social Determinants of Health   Financial Resource Strain: Low Risk  (10/28/2021)   Overall Financial Resource Strain (CARDIA)    Difficulty of Paying Living Expenses: Not hard at all  Food Insecurity: No Food Insecurity (10/28/2021)   Hunger Vital Sign    Worried About Running Out of Food in the Last Year: Never true    Ran Out of Food in the Last  Year: Never true  Transportation Needs: No Transportation Needs (10/28/2021)   PRAPARE - Hydrologist (Medical): No    Lack of Transportation (Non-Medical): No  Physical Activity: Sufficiently Active (10/28/2021)   Exercise Vital Sign    Days of Exercise per Week: 3 days    Minutes of Exercise per Session: 60 min  Stress: No Stress Concern Present (10/28/2021)   [place]    Feeling of Stress : Not at all  Social Connections: Moderately Isolated (10/28/2021)   Social Connection and Isolation Panel [NHANES]    Frequency of Communication with Friends and Family: Once a week    Frequency of Social Gatherings with Friends and Family: Once a week    Attends Religious Services: More than 4 times per year    Active Member of [employer] or Organizations: Yes    Attends Music therapist: More than 4 times per year    Marital Status: Never married    Tobacco Counseling Counseling given: Not Answered   Clinical Intake:                 Diabetic?No         Activities of Daily Living     No data to display          Patient Care Team: Lindell Spar, MD as PCP - General (Internal Medicine)  Indicate any recent Medical Services you may have received from other than [facility] providers in the past year (date may be approximate).     Assessment:   This is a routine wellness examination for Nicholas Dunlap.  Hearing/Vision screen No results found.  Dietary issues and exercise activities discussed:     Goals Addressed   None    Depression Screen    08/25/2022    1:59 PM 04/15/2022   10:43 AM 03/16/2022    2:44 PM 10/28/2021    4:26 PM 10/20/2021    9:36 AM 07/20/2021    9:00 AM  PHQ 2/9 Scores  PHQ - 2 Score 0 0 0 0 0 0    Fall Risk    08/25/2022    1:59 PM 06/21/2022    9:12 AM 04/15/2022   10:43 AM 03/16/2022    2:44 PM 02/14/2022   10:36 AM  [place] in the  past year? 0 0 0 0 0  Number falls in past yr: 0  0 0 0  Injury with Fall? 0  0 0 0  Risk for fall due to : No Fall Risks  No Fall Risks No Fall Risks No Fall Risks  Follow up Falls evaluation completed Falls evaluation completed Falls evaluation completed Falls evaluation completed Falls  evaluation completed    FALL RISK PREVENTION PERTAINING TO THE HOME:  Any stairs in or around the home? Yes If so, are there any without handrails? No  Home free of loose throw rugs in walkways, pet beds, electrical cords, etc? Yes  Adequate lighting in your home to reduce risk of falls? Yes   ASSISTIVE DEVICES UTILIZED TO PREVENT FALLS:  Life alert? No  Use of a cane, walker or w/c? No  Grab bars in the bathroom? No  Shower chair or bench in shower? No  Elevated toilet seat or a handicapped toilet? No   TIMED UP AND GO:  Was the test performed? No .  Length of time to ambulate 10 feet:  sec.     Cognitive Function:        10/28/2021    4:32 PM  6CIT Screen  What Year? 0 points  What month? 0 points  What time? 0 points  Count back from 20 0 points  Months in reverse 0 points  Repeat phrase 0 points  Total Score 0 points    Immunizations Immunization History  Administered Date(s) Administered   Tdap 09/18/2017    TDAP status: Up to date  Flu Vaccine status: Up to date  Pneumococcal vaccine status: Declined,  Education has been provided regarding the importance of this vaccine but patient still declined. Advised may receive this vaccine at local pharmacy or Health Dept. Aware to provide a copy of the vaccination record if obtained from local pharmacy or Health Dept. Verbalized acceptance and understanding.   Covid-19 vaccine status: Declined, Education has been provided regarding the importance of this vaccine but patient still declined. Advised may receive this vaccine at local pharmacy or Health Dept.or vaccine clinic. Aware to provide a copy of the vaccination record if  obtained from local pharmacy or Health Dept. Verbalized acceptance and understanding.  Qualifies for Shingles Vaccine? No   Zostavax completed No   Shingrix Completed?: No.    Education has been provided regarding the importance of this vaccine. Patient has been advised to call insurance company to determine out of pocket expense if they have not yet received this vaccine. Advised may also receive vaccine at local pharmacy or Health Dept. Verbalized acceptance and understanding.  Screening Tests Health Maintenance  Topic Date Due   Medicare Annual Wellness (AWV)  10/28/2022   INFLUENZA VACCINE  12/25/2022 (Originally 04/26/2022)   DTaP/Tdap/Td (2 - Td or Tdap) 09/19/2027   Hepatitis C Screening  Completed   HIV Screening  Completed   HPV VACCINES  Aged Out   COVID-19 Vaccine  Discontinued    Health Maintenance  Health Maintenance Due  Topic Date Due   Medicare Annual Wellness (AWV)  10/28/2022      Lung Cancer Screening: (Low Dose CT Chest recommended if Age 36-80 years, 30 pack-year currently smoking OR have quit w/in 15years.) does not qualify.   Lung Cancer Screening Referral:   Additional Screening:  Hepatitis C Screening: does qualify; Completed   Vision Screening: Recommended annual ophthalmology exams for early detection of glaucoma and other disorders of the eye. Is the patient up to date with their annual eye exam?  No  Who is the provider or what is the name of the office in which the patient attends annual eye exams?  If pt is not established with a provider, would they like to be referred to a provider to establish care? No .   Dental Screening: Recommended annual dental exams for proper oral  hygiene  Community Resource Referral / Chronic Care Management: CRR required this visit?  No   CCM required this visit?  No      Plan:     I have personally reviewed and noted the following in the patient's chart:   Medical and social history Use of alcohol,  tobacco or illicit drugs  Current medications and supplements including opioid prescriptions. Patient is not currently taking opioid prescriptions. Functional ability and status Nutritional status Physical activity Advanced directives List of other physicians Hospitalizations, surgeries, and ER visits in previous 12 months Vitals Screenings to include cognitive, depression, and falls Referrals and appointments  In addition, I have reviewed and discussed with patient certain preventive protocols, quality metrics, and best practice recommendations. A written personalized care plan for preventive services as well as general preventive health recommendations were provided to patient.     Orson Ape, [place]   11/17/2022   Nurse Notes:  Nicholas Dunlap , Thank you for taking time to come for your Medicare Wellness Visit. I appreciate your ongoing commitment to your health goals. Please review the following plan we discussed and let me know if I can assist you in the future.   These are the goals we discussed:  Goals   None     This is a list of the screening recommended for you and due dates:  Health Maintenance  Topic Date Due   Flu Shot  12/25/2022*   Medicare Annual Wellness Visit  11/18/2023   DTaP/Tdap/Td vaccine (2 - Td or Tdap) 09/19/2027   Hepatitis C Screening: USPSTF Recommendation to screen - Ages 29-79 yo.  Completed   HIV Screening  Completed   HPV Vaccine  Aged Out   COVID-19 Vaccine  Discontinued  *Topic was postponed. The date shown is not the original due date.

## 2022-11-17 NOTE — Patient Instructions (Signed)
Health Maintenance, Male Adopting a healthy lifestyle and getting preventive care are important in promoting health and wellness. Ask your health care provider about: The right schedule for you to have regular tests and exams. Things you can do on your own to prevent diseases and keep yourself healthy. What should I know about diet, weight, and exercise? Eat a healthy diet  Eat a diet that includes plenty of vegetables, fruits, low-fat dairy products, and lean protein. Do not eat a lot of foods that are high in solid fats, added sugars, or sodium. Maintain a healthy weight Body mass index (BMI) is a measurement that can be used to identify possible weight problems. It estimates body fat based on height and weight. Your health care provider can help determine your BMI and help you achieve or maintain a healthy weight. Get regular exercise Get regular exercise. This is one of the most important things you can do for your health. Most adults should: Exercise for at least 150 minutes each week. The exercise should increase your heart rate and make you sweat (moderate-intensity exercise). Do strengthening exercises at least twice a week. This is in addition to the moderate-intensity exercise. Spend less time sitting. Even light physical activity can be beneficial. Watch cholesterol and blood lipids Have your blood tested for lipids and cholesterol at 44 years of age, then have this test every 5 years. You may need to have your cholesterol levels checked more often if: Your lipid or cholesterol levels are high. You are older than 44 years of age. You are at high risk for heart disease. What should I know about cancer screening? Many types of cancers can be detected early and may often be prevented. Depending on your health history and family history, you may need to have cancer screening at various ages. This may include screening for: Colorectal cancer. Prostate cancer. Skin cancer. Lung  cancer. What should I know about heart disease, diabetes, and high blood pressure? Blood pressure and heart disease High blood pressure causes heart disease and increases the risk of stroke. This is more likely to develop in people who have high blood pressure readings or are overweight. Talk with your health care provider about your target blood pressure readings. Have your blood pressure checked: Every 3-5 years if you are 18-39 years of age. Every year if you are 40 years old or older. If you are between the ages of 65 and 75 and are a current or former smoker, ask your health care provider if you should have a one-time screening for abdominal aortic aneurysm (AAA). Diabetes Have regular diabetes screenings. This checks your fasting blood sugar level. Have the screening done: Once every three years after age 45 if you are at a normal weight and have a low risk for diabetes. More often and at a younger age if you are overweight or have a high risk for diabetes. What should I know about preventing infection? Hepatitis B If you have a higher risk for hepatitis B, you should be screened for this virus. Talk with your health care provider to find out if you are at risk for hepatitis B infection. Hepatitis C Blood testing is recommended for: Everyone born from 1945 through 1965. Anyone with known risk factors for hepatitis C. Sexually transmitted infections (STIs) You should be screened each year for STIs, including gonorrhea and chlamydia, if: You are sexually active and are younger than 44 years of age. You are older than 44 years of age and your   health care provider tells you that you are at risk for this type of infection. Your sexual activity has changed since you were last screened, and you are at increased risk for chlamydia or gonorrhea. Ask your health care provider if you are at risk. Ask your health care provider about whether you are at high risk for HIV. Your health care provider  may recommend a prescription medicine to help prevent HIV infection. If you choose to take medicine to prevent HIV, you should first get tested for HIV. You should then be tested every 3 months for as long as you are taking the medicine. Follow these instructions at home: Alcohol use Do not drink alcohol if your health care provider tells you not to drink. If you drink alcohol: Limit how much you have to 0-2 drinks a day. Know how much alcohol is in your drink. In the [place] one drink equals one 12 oz bottle of beer (355 mL), one 5 oz glass of wine (148 mL), or one 1 oz glass of hard liquor (44 mL). Lifestyle Do not use any products that contain nicotine or tobacco. These products include cigarettes, chewing tobacco, and vaping devices, such as e-cigarettes. If you need help quitting, ask your health care provider. Do not use street drugs. Do not share needles. Ask your health care provider for help if you need support or information about quitting drugs. General instructions Schedule regular health, dental, and eye exams. Stay current with your vaccines. Tell your health care provider if: You often feel depressed. You have ever been abused or do not feel safe at home. Summary Adopting a healthy lifestyle and getting preventive care are important in promoting health and wellness. Follow your health care provider's instructions about healthy diet, exercising, and getting tested or screened for diseases. Follow your health care provider's instructions on monitoring your cholesterol and blood pressure. This information is not intended to replace advice given to you by your health care provider. Make sure you discuss any questions you have with your health care provider. Document Revised: 02/01/2021 Document Reviewed: 02/01/2021 Elsevier Patient Education  2023 Elsevier Inc.  

## 2023-02-23 ENCOUNTER — Ambulatory Visit: Payer: Medicare Other | Admitting: Internal Medicine

## 2023-02-27 ENCOUNTER — Ambulatory Visit (INDEPENDENT_AMBULATORY_CARE_PROVIDER_SITE_OTHER): Payer: Medicare Other | Admitting: Internal Medicine

## 2023-02-27 ENCOUNTER — Encounter: Payer: Self-pay | Admitting: Internal Medicine

## 2023-02-27 VITALS — BP 134/86 | HR 72 | Ht 70.0 in | Wt 279.6 lb

## 2023-02-27 DIAGNOSIS — I10 Essential (primary) hypertension: Secondary | ICD-10-CM

## 2023-02-27 DIAGNOSIS — E782 Mixed hyperlipidemia: Secondary | ICD-10-CM | POA: Diagnosis not present

## 2023-02-27 DIAGNOSIS — G809 Cerebral palsy, unspecified: Secondary | ICD-10-CM | POA: Diagnosis not present

## 2023-02-27 DIAGNOSIS — L309 Dermatitis, unspecified: Secondary | ICD-10-CM

## 2023-02-27 DIAGNOSIS — M79605 Pain in left leg: Secondary | ICD-10-CM

## 2023-02-27 DIAGNOSIS — M79604 Pain in right leg: Secondary | ICD-10-CM

## 2023-02-27 DIAGNOSIS — D72819 Decreased white blood cell count, unspecified: Secondary | ICD-10-CM

## 2023-02-27 DIAGNOSIS — R739 Hyperglycemia, unspecified: Secondary | ICD-10-CM

## 2023-02-27 DIAGNOSIS — E559 Vitamin D deficiency, unspecified: Secondary | ICD-10-CM

## 2023-02-27 NOTE — Progress Notes (Signed)
Established Patient Office Visit  Subjective:  Patient ID: Nicholas Dunlap, male    DOB: Jun 27, 1981  Age: 42 y.o. MRN: [id number]  CC:  Chief Complaint  Patient presents with   Hypertension    Six month follow up for hypertension and hyperlipidemia. Patient has concerns for the arthritis in his legs    HPI Nicholas Dunlap is a 41 y.o. male with past medical history of cerebral palsy, HTN and HLD who presents for f/u of his chronic medical conditions.  HTN: BP is well-controlled now. Takes medications regularly. Patient denies headache, dizziness, chest pain, dyspnea or palpitations.  He was evaluated by Heme/Onc. for leukopenia, but his WBC counts were deemed to be stable. He denies any fever, chills, night sweats or unintentional weight loss.  Denies any signs of active bleeding or easy bruising.  He has pain in his thighs and legs upon exertion, which last for about 5-10 minutes.  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  He denies any pain around the hip and knee area.  He was concerned about arthritis, but his pain location does not match with arthritis.  He agrees to try Tylenol if he has persistent pain.   Past Medical History:  Diagnosis Date   Anal fistula    Cerebral palsy (HCC)    CLOSED FRACTURE UNSPEC PHALANX/PHALANGES HAND 08/01/2007   Qualifier: Diagnosis of  By: Romeo Apple MD, Stanley     Eczema    chest, buttock   Finger fracture, left 09/07/2016   fx./dislocation ring finger   Hyperlipidemia    Limp     Past Surgical History:  Procedure Laterality Date   EYE SURGERY Bilateral 1986   FINGER ARTHROPLASTY Left 09/13/2016   Procedure: LEFT RING FINGER VOLAR-PLATE REPAIR;  Surgeon: Mack Hook, MD;  [facility]:  [facility];  Service: Orthopedics;  Laterality: Left;   FOOT CAPSULE RELEASE W/ PERCUTANEOUS HEEL CORD LENGTHENING, TIBIAL TENDON TRANSFER Left    as a child   INCISION AND DRAINAGE PERIRECTAL  ABSCESS N/A 06/13/2022   Procedure: IRRIGATION AND DEBRIDEMENT PERIRECTAL ABSCESS;  Surgeon: Lucretia Roers, MD;  Location: [facility];  Service: General;  Laterality: N/A;    History reviewed. No pertinent family history.  Social History   Socioeconomic History   Marital status: Single    Spouse name: Not on file   Number of children: 0   Years of education: 12   Highest education level: Not on file  Occupational History   Not on file  Tobacco Use   Smoking status: Never   Smokeless tobacco: Never  Vaping Use   Vaping Use: Never used  Substance and Sexual Activity   Alcohol use: No   Drug use: No   Sexual activity: Not Currently  Other Topics Concern   Not on file  Social History Narrative   Not on file   Social Determinants of Health   Financial Resource Strain: Low Risk  (11/17/2022)   Overall Financial Resource Strain (CARDIA)    Difficulty of Paying Living Expenses: Not hard at all  Food Insecurity: No Food Insecurity (11/17/2022)   Hunger Vital Sign    Worried About Running Out of Food in the Last Year: Never true    Ran Out of Food in the Last Year: Never true  Transportation Needs: No Transportation Needs (11/17/2022)   PRAPARE - Administrator, Civil Service (Medical): No    Lack of Transportation (Non-Medical): No  Physical Activity: Sufficiently Active (11/17/2022)   Exercise Vital Sign    Days of Exercise per Week: 3 days    Minutes of Exercise per Session: 60 min  Stress: No Stress Concern Present (11/17/2022)   [employer] of Occupational Health - Occupational Stress Questionnaire    Feeling of Stress : Not at all  Social Connections: Moderately Isolated (11/17/2022)   Social Connection and Isolation Panel [NHANES]    Frequency of Communication with Friends and Family: Once a week    Frequency of Social Gatherings with Friends and Family: Once a week    Attends Religious Services: More than 4 times per year    Active Member of [employer] or  Organizations: Yes    Attends Banker Meetings: More than 4 times per year    Marital Status: Never married  Intimate Partner Violence: Not At Risk (11/17/2022)   Humiliation, Afraid, Rape, and Kick questionnaire    Fear of Current or Ex-Partner: No    Emotionally Abused: No    Physically Abused: No    Sexually Abused: No    Outpatient Medications Prior to Visit  Medication Sig Dispense Refill   Cholecalciferol (D3 5000 PO) Take by mouth. Take 1daily     clobetasol cream (TEMOVATE) 0.05 % Apply 1 Application topically 2 (two) times daily. 60 g 0   docusate sodium (COLACE) 100 MG capsule Take 1 capsule (100 mg total) by mouth 2 (two) times daily. 60 capsule 2   hydrOXYzine (VISTARIL) 25 MG capsule Take 1 capsule (25 mg total) by mouth every 8 (eight) hours as needed. 30 capsule 0   ondansetron (ZOFRAN) 4 MG tablet Take 1 tablet (4 mg total) by mouth every 8 (eight) hours as needed. (Patient not taking: Reported on 07/01/2022) 30 tablet 1   rosuvastatin (CRESTOR) 20 MG tablet TAKE 1 TABLET(20 MG) BY MOUTH DAILY 90 tablet 1   telmisartan (MICARDIS) 20 MG tablet TAKE 1 TABLET(20 MG) BY MOUTH DAILY 30 tablet 2   itraconazole (SPORANOX) 100 MG capsule Take 1 capsule (100 mg total) by mouth daily. 14 capsule 0   No facility-administered medications prior to visit.    Allergies  Allergen Reactions   Milk-Related Compounds Shortness Of Breath   Other Shortness Of Breath and Tinitus    Dairy Products    ROS Review of Systems  Constitutional:  Negative for chills and fever.  HENT:  Negative for congestion and sore throat.   Eyes:  Negative for pain and discharge.  Respiratory:  Negative for cough and shortness of breath.   Cardiovascular:  Negative for chest pain and palpitations.  Gastrointestinal:  Negative for constipation, diarrhea, nausea and vomiting.  Endocrine: Negative for polydipsia and polyuria.  Genitourinary:  Negative for dysuria and hematuria.   Musculoskeletal:  Positive for myalgias. Negative for neck pain and neck stiffness.  Skin:  Positive for rash.  Neurological:  Negative for dizziness, weakness, numbness and headaches.  Psychiatric/Behavioral:  Negative for agitation and behavioral problems.       Objective:    Physical Exam Vitals reviewed.  Constitutional:      General: He is not in acute distress.    Appearance: He is obese. He is not diaphoretic.  HENT:     Head: Normocephalic and atraumatic.     Nose: Nose normal.     Mouth/Throat:     Mouth: Mucous membranes are moist.  Eyes:     General: No scleral icterus.    Extraocular Movements: Extraocular movements intact.  Comments: B/l strabismus  Cardiovascular:     Rate and Rhythm: Normal rate and regular rhythm.     Pulses: Normal pulses.     Heart sounds: Normal heart sounds. No murmur heard. Pulmonary:     Breath sounds: Normal breath sounds. No wheezing or rales.  Abdominal:     Palpations: Abdomen is soft.     Tenderness: There is no abdominal tenderness.  Musculoskeletal:     Cervical back: Neck supple. No tenderness.     Right lower leg: No edema.     Left lower leg: No edema.  Feet:     Left foot:     Toenail Condition: Left toenails are abnormally thick. Fungal disease present. Skin:    General: Skin is warm.     Findings: No rash.  Neurological:     General: No focal deficit present.     Mental Status: He is alert and oriented to person, place, and time.     Sensory: No sensory deficit.     Motor: No weakness.  Psychiatric:        Mood and Affect: Mood normal.        Behavior: Behavior normal.     BP 134/86 (BP Location: Right Arm)   Pulse 72   Ht 5\' 10"  (1.778 m)   Wt 279 lb 9.6 oz (126.8 kg)   SpO2 97%   BMI 40.12 kg/m  Wt Readings from Last 3 Encounters:  02/27/23 279 lb 9.6 oz (126.8 kg)  08/25/22 278 lb 6.4 oz (126.3 kg)  07/21/22 269 lb (122 kg)    Lab Results  Component Value Date   TSH 1.360 02/27/2023   Lab  Results  Component Value Date   WBC 2.5 (LL) 02/27/2023   HGB 14.5 02/27/2023   HCT 44.8 02/27/2023   MCV 80 02/27/2023   PLT 140 (L) 02/27/2023   Lab Results  Component Value Date   NA 143 02/27/2023   K 4.6 02/27/2023   CO2 24 02/27/2023   GLUCOSE 89 02/27/2023   BUN 16 02/27/2023   CREATININE 1.08 02/27/2023   BILITOT 0.6 02/27/2023   ALKPHOS 71 02/27/2023   AST 21 02/27/2023   ALT 24 02/27/2023   PROT 6.8 02/27/2023   ALBUMIN 4.4 02/27/2023   CALCIUM 9.1 02/27/2023   ANIONGAP 7 06/13/2022   EGFR 87 02/27/2023   Lab Results  Component Value Date   CHOL 144 02/27/2023   Lab Results  Component Value Date   HDL 70 02/27/2023   Lab Results  Component Value Date   LDLCALC 66 02/27/2023   Lab Results  Component Value Date   TRIG 26 02/27/2023   Lab Results  Component Value Date   CHOLHDL 2.1 02/27/2023   Lab Results  Component Value Date   HGBA1C 5.4 02/27/2023      Assessment & Plan:   Problem List Items Addressed This Visit       Cardiovascular and Mediastinum   Essential hypertension - Primary    BP Readings from Last 1 Encounters:  02/27/23 134/86  Well-controlled with telmisartan 20 mg daily Advised DASH diet and moderate exercise/walking, at least 150 mins/week      Relevant Orders   TSH (Completed)   CMP14+EGFR (Completed)   CBC with Differential/Platelet (Completed)     Nervous and Auditory   Cerebral palsy (HCC)    History of cerebral palsy, has learning disability Able to ambulate without support Works as a Copy currently  Musculoskeletal and Integument   Eczema    Rash on right foot likely due to allergic dermatitis/eczema He has tried Kenalog cream with some improvement Switched to clobetasol cream, now improved        Other   Mixed hyperlipidemia    On Crestor Check lipid profile      Relevant Orders   Lipid panel (Completed)   Leukopenia    CBC reviewed - showed leukopenia, stable Had Heme/Onc. eval -  check CBC      Morbid obesity (HCC)    Diet modification and moderate exercise advised      Pain in both lower extremities    Likely due to exertional activity Tylenol as needed for pain      Other Visit Diagnoses     Vitamin D deficiency       Relevant Orders   VITAMIN D 25 Hydroxy (Vit-D Deficiency, Fractures) (Completed)   Hyperglycemia       Relevant Orders   Hemoglobin A1c (Completed)     No orders of the defined types were placed in this encounter.   Follow-up: Return in about 6 months (around 08/29/2023) for HTN and HLD.    Anabel Halon, MD

## 2023-02-27 NOTE — Patient Instructions (Addendum)
Please continue to take medications as prescribed.  Please continue to follow low carb diet and perform moderate exercise/walking at least 150 mins/week.  Please take Tylenol 500 mg as needed for leg pain.

## 2023-02-28 ENCOUNTER — Encounter: Payer: Self-pay | Admitting: Internal Medicine

## 2023-02-28 DIAGNOSIS — M79604 Pain in right leg: Secondary | ICD-10-CM | POA: Insufficient documentation

## 2023-02-28 LAB — CMP14+EGFR
ALT: 24 IU/L (ref 0–44)
AST: 21 IU/L (ref 0–40)
Albumin/Globulin Ratio: 1.8 (ref 1.2–2.2)
Albumin: 4.4 g/dL (ref 4.1–5.1)
Alkaline Phosphatase: 71 IU/L (ref 44–121)
BUN/Creatinine Ratio: 15 (ref 9–20)
BUN: 16 mg/dL (ref 6–24)
Bilirubin Total: 0.6 mg/dL (ref 0.0–1.2)
CO2: 24 mmol/L (ref 20–29)
Calcium: 9.1 mg/dL (ref 8.7–10.2)
Chloride: 106 mmol/L (ref 96–106)
Creatinine, Ser: 1.08 mg/dL (ref 0.76–1.27)
Globulin, Total: 2.4 g/dL (ref 1.5–4.5)
Glucose: 89 mg/dL (ref 70–99)
Potassium: 4.6 mmol/L (ref 3.5–5.2)
Sodium: 143 mmol/L (ref 134–144)
Total Protein: 6.8 g/dL (ref 6.0–8.5)
eGFR: 87 mL/min/{1.73_m2} (ref >59)

## 2023-02-28 LAB — CBC WITH DIFFERENTIAL/PLATELET
Basophils Absolute: 0 10*3/uL (ref 0.0–0.2)
Basos: 1 %
EOS (ABSOLUTE): 0 10*3/uL (ref 0.0–0.4)
Eos: 2 %
Hematocrit: 44.8 % (ref 37.5–51.0)
Hemoglobin: 14.5 g/dL (ref 13.0–17.7)
Immature Grans (Abs): 0 10*3/uL (ref 0.0–0.1)
Immature Granulocytes: 0 %
Lymphocytes Absolute: 1.1 10*3/uL (ref 0.7–3.1)
Lymphs: 44 %
MCH: 25.8 pg — ABNORMAL LOW (ref 26.6–33.0)
MCHC: 32.4 g/dL (ref 31.5–35.7)
MCV: 80 fL (ref 79–97)
Monocytes Absolute: 0.2 10*3/uL (ref 0.1–0.9)
Monocytes: 8 %
Neutrophils Absolute: 1.1 10*3/uL — ABNORMAL LOW (ref 1.4–7.0)
Neutrophils: 45 %
Platelets: 140 10*3/uL — ABNORMAL LOW (ref 150–450)
RBC: 5.61 x10E6/uL (ref 4.14–5.80)
RDW: 13.1 % (ref 11.6–15.4)
WBC: 2.5 10*3/uL — CL (ref 3.4–10.8)

## 2023-02-28 LAB — TSH: TSH: 1.36 u[IU]/mL (ref 0.450–4.500)

## 2023-02-28 LAB — VITAMIN D 25 HYDROXY (VIT D DEFICIENCY, FRACTURES): Vit D, 25-Hydroxy: 29.1 ng/mL — ABNORMAL LOW (ref 30.0–100.0)

## 2023-02-28 LAB — LIPID PANEL
Chol/HDL Ratio: 2.1 ratio (ref 0.0–5.0)
Cholesterol, Total: 144 mg/dL (ref 100–199)
HDL: 70 mg/dL (ref >39)
LDL Chol Calc (NIH): 66 mg/dL (ref 0–99)
Triglycerides: 26 mg/dL (ref 0–149)
VLDL Cholesterol Cal: 8 mg/dL (ref 5–40)

## 2023-02-28 LAB — HEMOGLOBIN A1C
Est. average glucose Bld gHb Est-mCnc: 108 mg/dL
Hgb A1c MFr Bld: 5.4 % (ref 4.8–5.6)

## 2023-02-28 NOTE — Assessment & Plan Note (Signed)
Diet modification and moderate exercise advised. 

## 2023-02-28 NOTE — Assessment & Plan Note (Signed)
History of cerebral palsy, has learning disability Able to ambulate without support Works as a janitor currently 

## 2023-02-28 NOTE — Assessment & Plan Note (Signed)
Likely due to exertional activity Tylenol as needed for pain

## 2023-02-28 NOTE — Assessment & Plan Note (Signed)
CBC reviewed - showed leukopenia, stable Had Heme/Onc. eval - check CBC

## 2023-02-28 NOTE — Assessment & Plan Note (Signed)
BP Readings from Last 1 Encounters:  02/27/23 134/86   Well-controlled with telmisartan 20 mg daily Advised DASH diet and moderate exercise/walking, at least 150 mins/week

## 2023-02-28 NOTE — Assessment & Plan Note (Signed)
Rash on right foot likely due to allergic dermatitis/eczema He has tried Kenalog cream with some improvement Switched to clobetasol cream, now improved

## 2023-02-28 NOTE — Assessment & Plan Note (Signed)
On Crestor Check lipid profile 

## 2023-03-24 ENCOUNTER — Other Ambulatory Visit: Payer: Self-pay | Admitting: Internal Medicine

## 2023-03-24 DIAGNOSIS — I10 Essential (primary) hypertension: Secondary | ICD-10-CM

## 2023-06-20 ENCOUNTER — Other Ambulatory Visit: Payer: Self-pay | Admitting: Internal Medicine

## 2023-06-20 DIAGNOSIS — E782 Mixed hyperlipidemia: Secondary | ICD-10-CM

## 2023-06-20 DIAGNOSIS — I10 Essential (primary) hypertension: Secondary | ICD-10-CM

## 2023-06-21 ENCOUNTER — Inpatient Hospital Stay: Payer: Medicare Other | Attending: Oncology

## 2023-06-21 DIAGNOSIS — D72819 Decreased white blood cell count, unspecified: Secondary | ICD-10-CM | POA: Diagnosis present

## 2023-06-21 DIAGNOSIS — D709 Neutropenia, unspecified: Secondary | ICD-10-CM

## 2023-06-21 LAB — CBC WITH DIFFERENTIAL/PLATELET
Abs Immature Granulocytes: 0 10*3/uL (ref 0.00–0.07)
Basophils Absolute: 0 10*3/uL (ref 0.0–0.1)
Basophils Relative: 0 %
Eosinophils Absolute: 0 10*3/uL (ref 0.0–0.5)
Eosinophils Relative: 1 %
HCT: 45 % (ref 39.0–52.0)
Hemoglobin: 14.5 g/dL (ref 13.0–17.0)
Immature Granulocytes: 0 %
Lymphocytes Relative: 41 %
Lymphs Abs: 1.3 10*3/uL (ref 0.7–4.0)
MCH: 26.1 pg (ref 26.0–34.0)
MCHC: 32.2 g/dL (ref 30.0–36.0)
MCV: 80.9 fL (ref 80.0–100.0)
Monocytes Absolute: 0.2 10*3/uL (ref 0.1–1.0)
Monocytes Relative: 8 %
Neutro Abs: 1.5 10*3/uL — ABNORMAL LOW (ref 1.7–7.7)
Neutrophils Relative %: 50 %
Platelets: 179 10*3/uL (ref 150–400)
RBC: 5.56 MIL/uL (ref 4.22–5.81)
RDW: 13.4 % (ref 11.5–15.5)
WBC: 3.1 10*3/uL — ABNORMAL LOW (ref 4.0–10.5)
nRBC: 0 % (ref 0.0–0.2)

## 2023-06-23 ENCOUNTER — Other Ambulatory Visit: Payer: Medicare Other

## 2023-06-23 ENCOUNTER — Inpatient Hospital Stay (HOSPITAL_BASED_OUTPATIENT_CLINIC_OR_DEPARTMENT_OTHER): Payer: Medicare Other | Admitting: Oncology

## 2023-06-23 DIAGNOSIS — D72819 Decreased white blood cell count, unspecified: Secondary | ICD-10-CM

## 2023-06-23 NOTE — Progress Notes (Signed)
Virtual Visit via Telephone Note  I connected with Audie Clear on 06/23/23 at  9:30 AM EDT by telephone and verified that I am speaking with the correct person using two identifiers.  Location: Patient: Home Provider: Home Office   I discussed the limitations, risks, security and privacy concerns of performing an evaluation and management service by telephone and the availability of in person appointments. I also discussed with the patient that there may be a patient responsible charge related to this service. The patient expressed understanding and agreed to proceed.   History of Present Illness: Mr. Kiester is a 42 year old male who is followed by hematology for chronic leukopenia.  He was last seen in clinic on 07/02/2023.  Since his last visit, he has done well.  Denies any recurrent or recent infections.  He denies any fever, chills, night sweats or unintentional weight loss.  He has not noticed any new lumps or bumps.  Energy and appetite are 100%.  He denies any hospitalizations, surgery or changes in baseline health.  Observations/Objective: Review of Systems  All other systems reviewed and are negative.  Physical Exam Neurological:     Mental Status: He is alert and oriented to person, place, and time.    Assessment and Plan: 1. Leukopenia, unspecified type -Mild neutropenia since 2013.  Baseline white blood cell count around 3 with an ANC of 1.5. -Hematology workup from 2023 revealed normal or/within normal limits HIV, hep C, hep B, ANA, RA, no evidence of nutritional deficiencies and normal LDH.  Flow cytometry negative for any monoclonal B-cell population or T-cell abnormalities. 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  Follow Up Instructions: -Given stability of counts, recommend follow-up with cbc annually with PCP.  Patient in agreement.  I discussed the assessment and  treatment plan with the patient. The patient was provided an opportunity to ask questions and all were answered. The patient agreed with the plan and demonstrated an understanding of the instructions.   The patient was advised to call back or seek an in-person evaluation if the symptoms worsen or if the condition fails to improve as anticipated.  I provided 18 minutes of non-face-to-face time during this encounter.   Mauro Kaufmann, NP

## 2023-08-29 ENCOUNTER — Ambulatory Visit (INDEPENDENT_AMBULATORY_CARE_PROVIDER_SITE_OTHER): Payer: Medicare Other | Admitting: Internal Medicine

## 2023-08-29 ENCOUNTER — Encounter: Payer: Self-pay | Admitting: Internal Medicine

## 2023-08-29 VITALS — BP 142/94 | HR 79 | Ht 70.0 in | Wt 278.4 lb

## 2023-08-29 DIAGNOSIS — G809 Cerebral palsy, unspecified: Secondary | ICD-10-CM

## 2023-08-29 DIAGNOSIS — E782 Mixed hyperlipidemia: Secondary | ICD-10-CM | POA: Diagnosis not present

## 2023-08-29 DIAGNOSIS — E559 Vitamin D deficiency, unspecified: Secondary | ICD-10-CM

## 2023-08-29 DIAGNOSIS — D72819 Decreased white blood cell count, unspecified: Secondary | ICD-10-CM

## 2023-08-29 DIAGNOSIS — I10 Essential (primary) hypertension: Secondary | ICD-10-CM | POA: Diagnosis not present

## 2023-08-29 MED ORDER — TELMISARTAN 40 MG PO TABS: 40.0000 mg | ORAL_TABLET | Freq: Every day | ORAL | 1 refills | Status: DC

## 2023-08-29 NOTE — Patient Instructions (Addendum)
Please start taking Telmisartan 40 mg once daily.  Please continue to take medications as prescribed.  Please continue to follow low salt diet and perform moderate exercise/walking at least 150 mins/week.  Please get fasting blood tests done before the next visit.

## 2023-08-29 NOTE — Assessment & Plan Note (Signed)
History of cerebral palsy, has learning disability Able to ambulate without support Works as a janitor currently 

## 2023-08-29 NOTE — Assessment & Plan Note (Signed)
On Crestor Check lipid profile 

## 2023-08-29 NOTE — Progress Notes (Signed)
Established Patient Office Visit  Subjective:  Patient ID: Nicholas Dunlap, male    DOB: 1981/07/08  Age: 42 y.o. MRN: [id number]  CC:  Chief Complaint  Patient presents with   Hypertension    Six month follow up    Hyperlipidemia    Six month follow up    HPI Nicholas Dunlap is a 42 y.o. male with past medical history of cerebral palsy, HTN and HLD who presents for f/u of his chronic medical conditions.  HTN: BP is elevated today. Takes medications regularly. Patient denies headache, dizziness, chest pain, dyspnea or palpitations.  HLD: He takes Crestor 20 mg QD for it.  He was evaluated by Heme/Onc. for leukopenia, but his WBC counts were deemed to be stable. He denies any fever, chills, night sweats or unintentional weight loss.  Denies any signs of active bleeding or easy bruising.    Past Medical History:  Diagnosis Date   Anal fistula    Cerebral palsy (HCC)    CLOSED FRACTURE UNSPEC PHALANX/PHALANGES HAND 08/01/2007   Qualifier: Diagnosis of  By: Romeo Apple MD, Stanley     Eczema    chest, buttock   Finger fracture, left 09/07/2016   fx./dislocation ring finger   Hyperlipidemia    Limp     Past Surgical History:  Procedure Laterality Date   EYE SURGERY Bilateral 1986   FINGER ARTHROPLASTY Left 09/13/2016   Procedure: LEFT RING FINGER VOLAR-PLATE REPAIR;  Surgeon: Mack Hook, MD;  Location: [facility];  Service: Orthopedics;  Laterality: Left;   FOOT CAPSULE RELEASE W/ PERCUTANEOUS HEEL CORD LENGTHENING, TIBIAL TENDON TRANSFER Left    as a child   INCISION AND DRAINAGE PERIRECTAL ABSCESS N/A 06/13/2022   Procedure: IRRIGATION AND DEBRIDEMENT PERIRECTAL ABSCESS;  Surgeon: Lucretia Roers, MD;  Location: [facility];  Service: General;  Laterality: N/A;    History reviewed. No pertinent family history.  Social History   Socioeconomic History   Marital status: Single    Spouse name: Not on file   Number of children: 0   Years of  education: 12   Highest education level: Not on file  Occupational History   Not on file  Tobacco Use   Smoking status: Never   Smokeless tobacco: Never  Vaping Use   Vaping status: Never Used  Substance and Sexual Activity   Alcohol use: No   Drug use: No   Sexual activity: Not Currently  Other Topics Concern   Not on file  Social History Narrative   Not on file   Social Determinants of Health   Financial Resource Strain: Low Risk  (11/17/2022)   Overall Financial Resource Strain (CARDIA)    Difficulty of Paying Living Expenses: Not hard at all  Food Insecurity: No Food Insecurity (11/17/2022)   Hunger Vital Sign    Worried About Running Out of Food in the Last Year: Never true    Ran Out of Food in the Last Year: Never true  Transportation Needs: No Transportation Needs (11/17/2022)   PRAPARE - Administrator, Civil Service (Medical): No    Lack of Transportation (Non-Medical): No  Physical Activity: Sufficiently Active (11/17/2022)   Exercise Vital Sign    Days of Exercise per Week: 3 days    Minutes of Exercise per Session: 60 min  Stress: No Stress Concern Present (11/17/2022)   [employer] of Occupational Health - Occupational Stress Questionnaire    Feeling of Stress : Not at all  Social Connections: Moderately Isolated (11/17/2022)   Social Connection and Isolation Panel [NHANES]    Frequency of Communication with Friends and Family: Once a week    Frequency of Social Gatherings with Friends and Family: Once a week    Attends Religious Services: More than 4 times per year    Active Member of [employer] or Organizations: Yes    Attends Banker Meetings: More than 4 times per year    Marital Status: Never married  Intimate Partner Violence: Not At Risk (11/17/2022)   Humiliation, Afraid, Rape, and Kick questionnaire    Fear of Current or Ex-Partner: No    Emotionally Abused: No    Physically Abused: No    Sexually Abused: No    Outpatient  Medications Prior to Visit  Medication Sig Dispense Refill   Cholecalciferol (D3 5000 PO) Take by mouth. Take 1daily     clobetasol cream (TEMOVATE) 0.05 % Apply 1 Application topically 2 (two) times daily. 60 g 0   hydrOXYzine (VISTARIL) 25 MG capsule Take 1 capsule (25 mg total) by mouth every 8 (eight) hours as needed. 30 capsule 0   rosuvastatin (CRESTOR) 20 MG tablet TAKE 1 TABLET(20 MG) BY MOUTH DAILY 90 tablet 1   telmisartan (MICARDIS) 20 MG tablet TAKE 1 TABLET(20 MG) BY MOUTH DAILY 30 tablet 2   No facility-administered medications prior to visit.    Allergies  Allergen Reactions   Milk-Related Compounds Shortness Of Breath   Other Shortness Of Breath and Tinitus    Dairy Products    ROS Review of Systems  Constitutional:  Negative for chills and fever.  HENT:  Negative for congestion and sore throat.   Eyes:  Negative for pain and discharge.  Respiratory:  Negative for cough and shortness of breath.   Cardiovascular:  Negative for chest pain and palpitations.  Gastrointestinal:  Negative for constipation, diarrhea, nausea and vomiting.  Endocrine: Negative for polydipsia and polyuria.  Genitourinary:  Negative for dysuria and hematuria.  Musculoskeletal:  Negative for neck pain and neck stiffness.  Skin:  Negative for rash.  Neurological:  Negative for dizziness, weakness, numbness and headaches.  Psychiatric/Behavioral:  Negative for agitation and behavioral problems.       Objective:    Physical Exam Vitals reviewed.  Constitutional:      General: He is not in acute distress.    Appearance: He is obese. He is not diaphoretic.  HENT:     Head: Normocephalic and atraumatic.     Nose: Nose normal.     Mouth/Throat:     Mouth: Mucous membranes are moist.  Eyes:     General: No scleral icterus.    Extraocular Movements: Extraocular movements intact.     Comments: B/l strabismus  Cardiovascular:     Rate and Rhythm: Normal rate and regular rhythm.      Pulses: Normal pulses.     Heart sounds: Normal heart sounds. No murmur heard. Pulmonary:     Breath sounds: Normal breath sounds. No wheezing or rales.  Abdominal:     Palpations: Abdomen is soft.     Tenderness: There is no abdominal tenderness.  Musculoskeletal:     Cervical back: Neck supple. No tenderness.     Right lower leg: No edema.     Left lower leg: No edema.  Skin:    General: Skin is warm.     Findings: No rash.  Neurological:     General: No focal deficit present.  Mental Status: He is alert and oriented to person, place, and time.     Sensory: No sensory deficit.     Motor: No weakness.  Psychiatric:        Mood and Affect: Mood normal.        Behavior: Behavior normal.     BP (!) 142/94 (BP Location: Left Arm)   Pulse 79   Ht 5\' 10"  (1.778 m)   Wt 278 lb 6.4 oz (126.3 kg)   SpO2 96%   BMI 39.95 kg/m  Wt Readings from Last 3 Encounters:  08/29/23 278 lb 6.4 oz (126.3 kg)  02/27/23 279 lb 9.6 oz (126.8 kg)  08/25/22 278 lb 6.4 oz (126.3 kg)    Lab Results  Component Value Date   TSH 1.360 02/27/2023   Lab Results  Component Value Date   WBC 3.1 (L) 06/21/2023   HGB 14.5 06/21/2023   HCT 45.0 06/21/2023   MCV 80.9 06/21/2023   PLT 179 06/21/2023   Lab Results  Component Value Date   NA 143 02/27/2023   K 4.6 02/27/2023   CO2 24 02/27/2023   GLUCOSE 89 02/27/2023   BUN 16 02/27/2023   CREATININE 1.08 02/27/2023   BILITOT 0.6 02/27/2023   ALKPHOS 71 02/27/2023   AST 21 02/27/2023   ALT 24 02/27/2023   PROT 6.8 02/27/2023   ALBUMIN 4.4 02/27/2023   CALCIUM 9.1 02/27/2023   ANIONGAP 7 06/13/2022   EGFR 87 02/27/2023   Lab Results  Component Value Date   CHOL 144 02/27/2023   Lab Results  Component Value Date   HDL 70 02/27/2023   Lab Results  Component Value Date   LDLCALC 66 02/27/2023   Lab Results  Component Value Date   TRIG 26 02/27/2023   Lab Results  Component Value Date   CHOLHDL 2.1 02/27/2023   Lab Results   Component Value Date   HGBA1C 5.4 02/27/2023      Assessment & Plan:   Problem List Items Addressed This Visit       Cardiovascular and Mediastinum   Essential hypertension - Primary    BP Readings from Last 1 Encounters:  08/29/23 (!) 142/94   Uncontrolled with telmisartan 20 mg QD Increased dose of telmisartan to 40 mg QD Advised DASH diet and moderate exercise/walking, at least 150 mins/week      Relevant Medications   telmisartan (MICARDIS) 40 MG tablet   Other Relevant Orders   TSH   CMP14+EGFR   CBC with Differential/Platelet     Nervous and Auditory   Cerebral palsy (HCC)    History of cerebral palsy, has learning disability Able to ambulate without support Works as a Copy currently        Other   Mixed hyperlipidemia    On Crestor Check lipid profile      Relevant Medications   telmisartan (MICARDIS) 40 MG tablet   Other Relevant Orders   Lipid panel   Leukopenia    CBC reviewed - showed leukopenia, stable Had Heme/Onc. eval - check CBC      Relevant Orders   CBC with Differential/Platelet   Other Visit Diagnoses     Vitamin D deficiency       Relevant Orders   VITAMIN D 25 Hydroxy (Vit-D Deficiency, Fractures)      Meds ordered this encounter  Medications   telmisartan (MICARDIS) 40 MG tablet    Sig: Take 1 tablet (40 mg total) by mouth daily.  Dispense:  90 tablet    Refill:  1    Dose change - 08/29/23    Follow-up: Return in about 3 months (around 11/27/2023) for HTN.    Anabel Halon, MD

## 2023-08-29 NOTE — Assessment & Plan Note (Signed)
CBC reviewed - showed leukopenia, stable Had Heme/Onc. eval - check CBC

## 2023-08-29 NOTE — Assessment & Plan Note (Signed)
BP Readings from Last 1 Encounters:  08/29/23 (!) 142/94   Uncontrolled with telmisartan 20 mg QD Increased dose of telmisartan to 40 mg QD Advised DASH diet and moderate exercise/walking, at least 150 mins/week

## 2023-11-20 ENCOUNTER — Ambulatory Visit (INDEPENDENT_AMBULATORY_CARE_PROVIDER_SITE_OTHER): Payer: Medicare Other

## 2023-11-20 VITALS — Ht 70.0 in | Wt 280.0 lb

## 2023-11-20 DIAGNOSIS — Z0000 Encounter for general adult medical examination without abnormal findings: Secondary | ICD-10-CM

## 2023-11-20 NOTE — Progress Notes (Signed)
Subjective:   Nicholas Dunlap is a 43 y.o. who presents for a Medicare Wellness preventive visit.  Visit Complete: Virtual I connected with  Nicholas Dunlap on 11/20/23 by a audio enabled telemedicine application and verified that I am speaking with the correct person using two identifiers.  Patient Location: Home  Provider Location: Home Office  I discussed the limitations of evaluation and management by telemedicine. The patient expressed understanding and agreed to proceed.  Vital Signs: Because this visit was a virtual/telehealth visit, some criteria may be missing or patient reported. Any vitals not documented were not able to be obtained and vitals that have been documented are patient reported.  VideoError- Librarian, academic were attempted between this provider and patient, however failed, due to patient having technical difficulties OR patient did not have access to video capability.  We continued and completed visit with audio only.   AWV Questionnaire: No: Patient Medicare AWV questionnaire was not completed prior to this visit.  Cardiac Risk Factors include: advanced age (>8men, >26 women);dyslipidemia;hypertension;male gender;obesity (BMI >30kg/m2)     Objective:    Today's Vitals   11/20/23 1354  Weight: 280 lb (127 kg)  Height: 5\' 10"  (1.778 m)   Body mass index is 40.18 kg/m.     11/20/2023    1:44 PM 11/17/2022    3:01 PM 07/01/2022    8:13 AM 06/13/2022    8:53 AM 12/22/2021    3:29 PM 10/28/2021    4:29 PM 10/07/2018    9:53 AM  Advanced Directives  Does Patient Have a Medical Advance Directive? No No No No No No No  Would patient like information on creating a medical advance directive? No - Patient declined No - Patient declined No - Patient declined No - Patient declined No - Patient declined No - Patient declined     Current Medications (verified) Outpatient Encounter Medications as of 11/20/2023  Medication Sig    Cholecalciferol (D3 5000 PO) Take by mouth. Take 1daily   clobetasol cream (TEMOVATE) 0.05 % Apply 1 Application topically 2 (two) times daily.   hydrOXYzine (VISTARIL) 25 MG capsule Take 1 capsule (25 mg total) by mouth every 8 (eight) hours as needed.   rosuvastatin (CRESTOR) 20 MG tablet TAKE 1 TABLET(20 MG) BY MOUTH DAILY   telmisartan (MICARDIS) 40 MG tablet Take 1 tablet (40 mg total) by mouth daily.   No facility-administered encounter medications on file as of 11/20/2023.    Allergies (verified) Milk-related compounds and Other   History: Past Medical History:  Diagnosis Date   Anal fistula    Cerebral palsy (HCC)    CLOSED FRACTURE UNSPEC PHALANX/PHALANGES HAND 08/01/2007   Qualifier: Diagnosis of  By: Romeo Apple MD, Stanley     Eczema    chest, buttock   Finger fracture, left 09/07/2016   fx./dislocation ring finger   Hyperlipidemia    Limp    Past Surgical History:  Procedure Laterality Date   EYE SURGERY Bilateral 1986   FINGER ARTHROPLASTY Left 09/13/2016   Procedure: LEFT RING FINGER VOLAR-PLATE REPAIR;  Surgeon: Mack Hook, MD;  Location: [facility];  Service: Orthopedics;  Laterality: Left;   FOOT CAPSULE RELEASE W/ PERCUTANEOUS HEEL CORD LENGTHENING, TIBIAL TENDON TRANSFER Left    as a child   INCISION AND DRAINAGE PERIRECTAL ABSCESS N/A 06/13/2022   Procedure: IRRIGATION AND DEBRIDEMENT PERIRECTAL ABSCESS;  Surgeon: Lucretia Roers, MD;  Location: [facility];  Service: General;  Laterality: N/A;  History reviewed. No pertinent family history. Social History   Socioeconomic History   Marital status: Single    Spouse name: Not on file   Number of children: 0   Years of education: 12   Highest education level: Not on file  Occupational History   Not on file  Tobacco Use   Smoking status: Never   Smokeless tobacco: Never  Vaping Use   Vaping status: Never Used  Substance and Sexual Activity   Alcohol use: No   Drug use: No   Sexual  activity: Not Currently  Other Topics Concern   Not on file  Social History Narrative   Not on file   Social Drivers of Health   Financial Resource Strain: Low Risk  (11/20/2023)   Overall Financial Resource Strain (CARDIA)    Difficulty of Paying Living Expenses: Not hard at all  Food Insecurity: No Food Insecurity (11/20/2023)   Hunger Vital Sign    Worried About Running Out of Food in the Last Year: Never true    Ran Out of Food in the Last Year: Never true  Transportation Needs: No Transportation Needs (11/20/2023)   PRAPARE - Administrator, Civil Service (Medical): No    Lack of Transportation (Non-Medical): No  Physical Activity: Insufficiently Active (11/20/2023)   Exercise Vital Sign    Days of Exercise per Week: 3 days    Minutes of Exercise per Session: 40 min  Stress: No Stress Concern Present (11/20/2023)   [employer] of Occupational Health - Occupational Stress Questionnaire    Feeling of Stress : Only a little  Social Connections: Socially Isolated (11/20/2023)   Social Connection and Isolation Panel [NHANES]    Frequency of Communication with Friends and Family: Once a week    Frequency of Social Gatherings with Friends and Family: Never    Attends Religious Services: More than 4 times per year    Active Member of [employer] or Organizations: No    Attends Engineer, structural: Never    Marital Status: Never married    Tobacco Counseling Counseling given: Yes    Clinical Intake:  Pre-visit preparation completed: Yes  Pain : No/denies pain     BMI - recorded: 40.18 Nutritional Status: BMI > 30  Obese Nutritional Risks: None Diabetes: No  How often do you need to have someone help you when you read instructions, pamphlets, or other written materials from your doctor or pharmacy?: 1 - Never  Interpreter Needed?: No  Information entered by :: Staci Carver W, CMA   Activities of Daily Living     11/20/2023    1:43 PM  In your present  state of health, do you have any difficulty performing the following activities:  Hearing? 0  Vision? 0  Difficulty concentrating or making decisions? 0  Walking or climbing stairs? 0  Dressing or bathing? 0  Doing errands, shopping? 0  Preparing Food and eating ? N  Using the Toilet? N  In the past six months, have you accidently leaked urine? N  Do you have problems with loss of bowel control? N  Managing your Medications? N  Managing your Finances? N  Housekeeping or managing your Housekeeping? N    Patient Care Team: Anabel Halon, MD as PCP - General (Internal Medicine) Mauro Kaufmann, NP as Nurse Practitioner (Oncology) Darolyn Rua as Physician Assistant (Oncology)  Indicate any recent Medical Services you may have received from other than [facility] providers in the past  year (date may be approximate).     Assessment:   This is a routine wellness examination for Nicholas Dunlap.  Hearing/Vision screen Hearing Screening - Comments:: Patient denies any hearing difficulties.   Vision Screening - Comments:: Patient is not up to date on yearly eye exams.  Patient declined referral to establish with an ophthalmologist.     Goals Addressed             This Visit's Progress    Patient Stated       Lose weight       Depression Screen     11/20/2023    1:46 PM 08/29/2023    8:51 AM 02/27/2023    8:54 AM 11/17/2022    3:00 PM 08/25/2022    1:59 PM 04/15/2022   10:43 AM 03/16/2022    2:44 PM  PHQ 2/9 Scores  PHQ - 2 Score 3 0 0 0 0 0 0  PHQ- 9 Score 3          Fall Risk     11/20/2023    1:44 PM 08/29/2023    8:51 AM 02/27/2023    8:54 AM 11/17/2022    3:01 PM 08/25/2022    1:59 PM  Fall Risk   Falls in the past year? 0 0 1 1 0  Number falls in past yr: 0 0 0 0 0  Injury with Fall? 0 0 0 0 0  Risk for fall due to : No Fall Risks No Fall Risks   No Fall Risks  Follow up Falls prevention discussed;Falls evaluation completed Falls evaluation completed    Falls evaluation completed    MEDICARE RISK AT HOME:  Medicare Risk at Home Any stairs in or around the home?: No If so, are there any without handrails?: No Home free of loose throw rugs in walkways, pet beds, electrical cords, etc?: Yes Adequate lighting in your home to reduce risk of falls?: Yes Life alert?: No Use of a cane, walker or w/c?: No Grab bars in the bathroom?: No Shower chair or bench in shower?: Yes Elevated toilet seat or a handicapped toilet?: No  TIMED UP AND GO:  Was the test performed?  No  Cognitive Function: 6CIT completed        11/20/2023    1:45 PM 11/17/2022    3:01 PM 10/28/2021    4:32 PM  6CIT Screen  What Year? 0 points 0 points 0 points  What month? 0 points 0 points 0 points  What time? 0 points 0 points 0 points  Count back from 20 0 points 0 points 0 points  Months in reverse 0 points 0 points 0 points  Repeat phrase 0 points 0 points 0 points  Total Score 0 points 0 points 0 points    Immunizations Immunization History  Administered Date(s) Administered   Tdap 09/18/2017    Screening Tests Health Maintenance  Topic Date Due   Pneumococcal Vaccine 57-9 Years old (1 of 2 - PCV) Never done   INFLUENZA VACCINE  12/25/2023 (Originally 04/27/2023)   Medicare Annual Wellness (AWV)  11/19/2024   DTaP/Tdap/Td (2 - Td or Tdap) 09/19/2027   Hepatitis C Screening  Completed   HIV Screening  Completed   HPV VACCINES  Aged Out   COVID-19 Vaccine  Discontinued    Health Maintenance  Health Maintenance Due  Topic Date Due   Pneumococcal Vaccine 12-55 Years old (1 of 2 - PCV) Never done   Health Maintenance Items Addressed: Advised  patient he is due for Pneumonia. Verbalized understanding,   Additional Screening:  Vision Screening: Recommended annual ophthalmology exams for early detection of glaucoma and other disorders of the eye.  Dental Screening: Recommended annual dental exams for proper oral hygiene  Community Resource  Referral / Chronic Care Management: CRR required this visit?  No   CCM required this visit?  No     Plan:     I have personally reviewed and noted the following in the patient's chart:   Medical and social history Use of alcohol, tobacco or illicit drugs  Current medications and supplements including opioid prescriptions. Patient is not currently taking opioid prescriptions. Functional ability and status Nutritional status Physical activity Advanced directives List of other physicians Hospitalizations, surgeries, and ER visits in previous 12 months Vitals Screenings to include cognitive, depression, and falls Referrals and appointments  In addition, I have reviewed and discussed with patient certain preventive protocols, quality metrics, and best practice recommendations. A written personalized care plan for preventive services as well as general preventive health recommendations were provided to patient.     Jordan Hawks Yoshio Seliga, CMA   11/20/2023   After Visit Summary: (Mail) Due to this being a telephonic visit, the after visit summary with patients personalized plan was offered to patient via mail   Notes: Nothing significant to report at this time.

## 2023-11-20 NOTE — Patient Instructions (Signed)
 Nicholas Dunlap , Thank you for taking time to come for your Medicare Wellness Visit. I appreciate your ongoing commitment to your health goals. Please review the following plan we discussed and let me know if I can assist you in the future.   Referrals/Orders/Follow-Ups/Clinician Recommendations:  Next Medicare Annual Wellness Visit:   November 20, 2024 at 12:30 pm telephone visit.   This is a list of the screening recommended for you and due dates:  Health Maintenance  Topic Date Due   Pneumococcal Vaccination (1 of 2 - PCV) Never done   Flu Shot  12/25/2023*   Medicare Annual Wellness Visit  11/19/2024   DTaP/Tdap/Td vaccine (2 - Td or Tdap) 09/19/2027   Hepatitis C Screening  Completed   HIV Screening  Completed   HPV Vaccine  Aged Out   COVID-19 Vaccine  Discontinued  *Topic was postponed. The date shown is not the original due date.    Advanced directives: (Declined) Advance directive discussed with you today. Even though you declined this today, please call our office should you change your mind, and we can give you the proper paperwork for you to fill out.  Next Medicare Annual Wellness Visit scheduled for next year: yes  Understanding Your Risk for Falls Millions of people have serious injuries from falls each year. It is important to understand your risk of falling. Talk with your health care provider about your risk and what you can do to lower it. If you do have a serious fall, make sure to tell your provider. Falling once raises your risk of falling again. How can falls affect me? Serious injuries from falls are common. These include: Broken bones, such as hip fractures. Head injuries, such as traumatic brain injuries (TBI) or concussions. A fear of falling can cause you to avoid activities and stay at home. This can make your muscles weaker and raise your risk for a fall. What can increase my risk? There are a number of risk factors that increase your risk for falling. The  more risk factors you have, the higher your risk of falling. Serious injuries from a fall happen most often to people who are older than 45 years old. Teenagers and young adults ages 54-29 are also at higher risk. Common risk factors include: Weakness in the lower body. Being generally weak or confused due to long-term (chronic) illness. Dizziness or balance problems. Poor vision. Medicines that cause dizziness or drowsiness. These may include: Medicines for your blood pressure, heart, anxiety, insomnia, or swelling (edema). Pain medicines. Muscle relaxants. Other risk factors include: Drinking alcohol. Having had a fall in the past. Having foot pain or wearing improper footwear. Working at a dangerous job. Having any of the following in your home: Tripping hazards, such as floor clutter or loose rugs. Poor lighting. Pets. Having dementia or memory loss. What actions can I take to lower my risk of falling?     Physical activity Stay physically fit. Do strength and balance exercises. Consider taking a regular class to build strength and balance. Yoga and tai chi are good options. Vision Have your eyes checked every year and your prescription for glasses or contacts updated as needed. Shoes and walking aids Wear non-skid shoes. Wear shoes that have rubber soles and low heels. Do not wear high heels. Do not walk around the house in socks or slippers. Use a cane or walker as told by your provider. Home safety Attach secure railings on both sides of your stairs. Install grab  bars for your bathtub, shower, and toilet. Use a non-skid mat in your bathtub or shower. Attach bath mats securely with double-sided, non-slip rug tape. Use good lighting in all rooms. Keep a flashlight near your bed. Make sure there is a clear path from your bed to the bathroom. Use night-lights. Do not use throw rugs. Make sure all carpeting is taped or tacked down securely. Remove all clutter from walkways  and stairways, including extension cords. Repair uneven or broken steps and floors. Avoid walking on icy or slippery surfaces. Walk on the grass instead of on icy or slick sidewalks. Use ice melter to get rid of ice on walkways in the winter. Use a cordless phone. Questions to ask your health care provider Can you help me check my risk for a fall? Do any of my medicines make me more likely to fall? Should I take a vitamin D supplement? What exercises can I do to improve my strength and balance? Should I make an appointment to have my vision checked? Do I need a bone density test to check for weak bones (osteoporosis)? Would it help to use a cane or a walker? Where to find more information Centers for Disease Control and Prevention, STEADI: TonerPromos.no Community-Based Fall Prevention Programs: TonerPromos.no [employer] on Aging: BaseRingTones.pl Contact a health care provider if: You fall at home. You are afraid of falling at home. You feel weak, drowsy, or dizzy. This information is not intended to replace advice given to you by your health care provider. Make sure you discuss any questions you have with your health care provider. Document Revised: 05/16/2022 Document Reviewed: 05/16/2022 Elsevier Patient Education  2024 [employer].

## 2023-11-27 ENCOUNTER — Ambulatory Visit: Payer: Medicare Other | Admitting: Internal Medicine

## 2023-12-11 ENCOUNTER — Ambulatory Visit: Payer: Medicare Other

## 2023-12-18 ENCOUNTER — Other Ambulatory Visit: Payer: Self-pay | Admitting: Internal Medicine

## 2023-12-18 DIAGNOSIS — E782 Mixed hyperlipidemia: Secondary | ICD-10-CM

## 2024-01-24 ENCOUNTER — Encounter: Payer: Self-pay | Admitting: Internal Medicine

## 2024-01-24 ENCOUNTER — Ambulatory Visit (INDEPENDENT_AMBULATORY_CARE_PROVIDER_SITE_OTHER): Admitting: Internal Medicine

## 2024-01-24 VITALS — BP 125/84 | HR 94 | Ht 70.0 in | Wt 277.8 lb

## 2024-01-24 DIAGNOSIS — G809 Cerebral palsy, unspecified: Secondary | ICD-10-CM

## 2024-01-24 DIAGNOSIS — E782 Mixed hyperlipidemia: Secondary | ICD-10-CM

## 2024-01-24 DIAGNOSIS — Z800 Family history of malignant neoplasm of digestive organs: Secondary | ICD-10-CM | POA: Insufficient documentation

## 2024-01-24 DIAGNOSIS — I10 Essential (primary) hypertension: Secondary | ICD-10-CM | POA: Diagnosis not present

## 2024-01-24 DIAGNOSIS — R739 Hyperglycemia, unspecified: Secondary | ICD-10-CM

## 2024-01-24 DIAGNOSIS — E559 Vitamin D deficiency, unspecified: Secondary | ICD-10-CM

## 2024-01-24 DIAGNOSIS — D72819 Decreased white blood cell count, unspecified: Secondary | ICD-10-CM

## 2024-01-24 MED ORDER — ROSUVASTATIN CALCIUM 20 MG PO TABS: 20.0000 mg | ORAL_TABLET | Freq: Every day | ORAL | 3 refills | Status: DC

## 2024-01-24 MED ORDER — TELMISARTAN 40 MG PO TABS: 40.0000 mg | ORAL_TABLET | Freq: Every day | ORAL | 3 refills | Status: AC

## 2024-01-24 NOTE — Progress Notes (Signed)
 Established Patient Office Visit  Subjective:  Patient ID: Nicholas Dunlap, male    DOB: 1980-11-11  Age: 44 y.o. MRN: [id number]  CC:  Chief Complaint  Patient presents with   Medical Management of Chronic Issues    3 month f/u    HPI Nicholas Dunlap is a 43 y.o. male with past medical history of cerebral palsy, HTN and HLD who presents for f/u of his chronic medical conditions.  HTN: BP is wnl today. Takes telmisartan  40 mg QD regularly. Patient denies headache, dizziness, chest pain, dyspnea or palpitations.  HLD: He takes Crestor  20 mg QD for it.  He was evaluated by Heme/Onc. for leukopenia, but his WBC counts were deemed to be stable. He denies any fever, chills, night sweats or unintentional weight loss.  Denies any signs of active bleeding or easy bruising.    Past Medical History:  Diagnosis Date   Anal fistula    Cerebral palsy (HCC)    CLOSED FRACTURE UNSPEC PHALANX/PHALANGES HAND 08/01/2007   Qualifier: Diagnosis of  By: Phyllis Breeze MD, Stanley     Eczema    chest, buttock   Finger fracture, left 09/07/2016   fx./dislocation ring finger   Hyperlipidemia    Limp     Past Surgical History:  Procedure Laterality Date   EYE SURGERY Bilateral 1986   FINGER ARTHROPLASTY Left 09/13/2016   Procedure: LEFT RING FINGER VOLAR-PLATE REPAIR;  Surgeon: Rober Chimera, MD;  Location: [facility];  Service: Orthopedics;  Laterality: Left;   FOOT CAPSULE RELEASE W/ PERCUTANEOUS HEEL CORD LENGTHENING, TIBIAL TENDON TRANSFER Left    as a child   INCISION AND DRAINAGE PERIRECTAL ABSCESS N/A 06/13/2022   Procedure: IRRIGATION AND DEBRIDEMENT PERIRECTAL ABSCESS;  Surgeon: Awilda Bogus, MD;  Location: [facility];  Service: General;  Laterality: N/A;    History reviewed. No pertinent family history.  Social History   Socioeconomic History   Marital status: Single    Spouse name: Not on file   Number of children: 0   Years of education: 12   Highest  education level: Not on file  Occupational History   Not on file  Tobacco Use   Smoking status: Never   Smokeless tobacco: Never  Vaping Use   Vaping status: Never Used  Substance and Sexual Activity   Alcohol use: No   Drug use: No   Sexual activity: Not Currently  Other Topics Concern   Not on file  Social History Narrative   Not on file   Social Drivers of Health   Financial Resource Strain: Low Risk  (11/20/2023)   Overall Financial Resource Strain (CARDIA)    Difficulty of Paying Living Expenses: Not hard at all  Food Insecurity: No Food Insecurity (11/20/2023)   Hunger Vital Sign    Worried About Running Out of Food in the Last Year: Never true    Ran Out of Food in the Last Year: Never true  Transportation Needs: No Transportation Needs (11/20/2023)   PRAPARE - Administrator, Civil Service (Medical): No    Lack of Transportation (Non-Medical): No  Physical Activity: Insufficiently Active (11/20/2023)   Exercise Vital Sign    Days of Exercise per Week: 3 days    Minutes of Exercise per Session: 40 min  Stress: No Stress Concern Present (11/20/2023)   [employer] of Occupational Health - Occupational Stress Questionnaire    Feeling of Stress : Only a little  Social Connections: Socially Isolated (11/20/2023)  Social Advertising account executive [NHANES]    Frequency of Communication with Friends and Family: Once a week    Frequency of Social Gatherings with Friends and Family: Never    Attends Religious Services: More than 4 times per year    Active Member of [employer] or Organizations: No    Attends Banker Meetings: Never    Marital Status: Never married  Intimate Partner Violence: Not At Risk (11/20/2023)   Humiliation, Afraid, Rape, and Kick questionnaire    Fear of Current or Ex-Partner: No    Emotionally Abused: No    Physically Abused: No    Sexually Abused: No    Outpatient Medications Prior to Visit  Medication Sig Dispense  Refill   Cholecalciferol (D3 5000 PO) Take by mouth. Take 1daily     clobetasol  cream (TEMOVATE ) 0.05 % Apply 1 Application topically 2 (two) times daily. 60 g 0   hydrOXYzine  (VISTARIL ) 25 MG capsule Take 1 capsule (25 mg total) by mouth every 8 (eight) hours as needed. 30 capsule 0   rosuvastatin  (CRESTOR ) 20 MG tablet TAKE 1 TABLET(20 MG) BY MOUTH DAILY 90 tablet 1   telmisartan  (MICARDIS ) 40 MG tablet Take 1 tablet (40 mg total) by mouth daily. 90 tablet 1   No facility-administered medications prior to visit.    Allergies  Allergen Reactions   Milk-Related Compounds Shortness Of Breath   Other Shortness Of Breath and Tinitus    Dairy Products    ROS Review of Systems  Constitutional:  Negative for chills and fever.  HENT:  Negative for congestion and sore throat.   Eyes:  Negative for pain and discharge.  Respiratory:  Negative for cough and shortness of breath.   Cardiovascular:  Negative for chest pain and palpitations.  Gastrointestinal:  Negative for constipation, diarrhea, nausea and vomiting.  Endocrine: Negative for polydipsia and polyuria.  Genitourinary:  Negative for dysuria and hematuria.  Musculoskeletal:  Negative for neck pain and neck stiffness.  Skin:  Negative for rash.  Neurological:  Negative for dizziness, weakness, numbness and headaches.  Psychiatric/Behavioral:  Negative for agitation and behavioral problems.       Objective:    Physical Exam Vitals reviewed.  Constitutional:      General: He is not in acute distress.    Appearance: He is obese. He is not diaphoretic.  HENT:     Head: Normocephalic and atraumatic.     Nose: Nose normal.     Mouth/Throat:     Mouth: Mucous membranes are moist.  Eyes:     General: No scleral icterus.    Extraocular Movements: Extraocular movements intact.     Comments: B/l strabismus  Cardiovascular:     Rate and Rhythm: Normal rate and regular rhythm.     Heart sounds: Normal heart sounds. No murmur  heard. Pulmonary:     Breath sounds: Normal breath sounds. No wheezing or rales.  Musculoskeletal:     Cervical back: Neck supple. No tenderness.     Right lower leg: No edema.     Left lower leg: No edema.  Skin:    General: Skin is warm.     Findings: No rash.  Neurological:     General: No focal deficit present.     Mental Status: He is alert and oriented to person, place, and time.     Sensory: No sensory deficit.     Motor: No weakness.  Psychiatric:        Mood and  Affect: Mood normal.        Behavior: Behavior normal.     BP 125/84   Pulse 94   Ht 5\' 10"  (1.778 m)   Wt 277 lb 12.8 oz (126 kg)   SpO2 95%   BMI 39.86 kg/m  Wt Readings from Last 3 Encounters:  01/24/24 277 lb 12.8 oz (126 kg)  11/20/23 280 lb (127 kg)  08/29/23 278 lb 6.4 oz (126.3 kg)    Lab Results  Component Value Date   TSH 1.360 02/27/2023   Lab Results  Component Value Date   WBC 3.1 (L) 06/21/2023   HGB 14.5 06/21/2023   HCT 45.0 06/21/2023   MCV 80.9 06/21/2023   PLT 179 06/21/2023   Lab Results  Component Value Date   NA 143 02/27/2023   K 4.6 02/27/2023   CO2 24 02/27/2023   GLUCOSE 89 02/27/2023   BUN 16 02/27/2023   CREATININE 1.08 02/27/2023   BILITOT 0.6 02/27/2023   ALKPHOS 71 02/27/2023   AST 21 02/27/2023   ALT 24 02/27/2023   PROT 6.8 02/27/2023   ALBUMIN 4.4 02/27/2023   CALCIUM  9.1 02/27/2023   ANIONGAP 7 06/13/2022   EGFR 87 02/27/2023   Lab Results  Component Value Date   CHOL 144 02/27/2023   Lab Results  Component Value Date   HDL 70 02/27/2023   Lab Results  Component Value Date   LDLCALC 66 02/27/2023   Lab Results  Component Value Date   TRIG 26 02/27/2023   Lab Results  Component Value Date   CHOLHDL 2.1 02/27/2023   Lab Results  Component Value Date   HGBA1C 5.4 02/27/2023      Assessment & Plan:   Problem List Items Addressed This Visit       Cardiovascular and Mediastinum   Essential hypertension - Primary   BP Readings  from Last 1 Encounters:  01/24/24 125/84   Well-controlled with telmisartan  40 mg QD Advised DASH diet and moderate exercise/walking, at least 150 mins/week      Relevant Medications   rosuvastatin  (CRESTOR ) 20 MG tablet   telmisartan  (MICARDIS ) 40 MG tablet   Other Relevant Orders   TSH   CMP14+EGFR   CBC with Differential/Platelet     Nervous and Auditory   Cerebral palsy (HCC)   History of cerebral palsy, has learning disability Able to ambulate without support Works as a Copy currently        Other   Mixed hyperlipidemia   On Crestor  20 mg QD Check lipid profile      Relevant Medications   rosuvastatin  (CRESTOR ) 20 MG tablet   telmisartan  (MICARDIS ) 40 MG tablet   Other Relevant Orders   Lipid panel   Leukopenia   CBC reviewed - showed leukopenia, stable Had Heme/Onc. eval - check CBC      Relevant Orders   CBC with Differential/Platelet   Family history of colon cancer in mother   His mother had colon cancer at the age of 15 Referred to GI for colonoscopy      Relevant Orders   Ambulatory referral to Gastroenterology   Other Visit Diagnoses       Hyperglycemia       Relevant Orders   Hemoglobin A1c     Vitamin D  deficiency       Relevant Orders   VITAMIN D  25 Hydroxy (Vit-D Deficiency, Fractures)      Meds ordered this encounter  Medications   rosuvastatin  (CRESTOR ) 20 MG  tablet    Sig: Take 1 tablet (20 mg total) by mouth daily.    Dispense:  90 tablet    Refill:  3   telmisartan  (MICARDIS ) 40 MG tablet    Sig: Take 1 tablet (40 mg total) by mouth daily.    Dispense:  90 tablet    Refill:  3    Dose change - 08/29/23    Follow-up: Return in about 6 months (around 07/25/2024) for HTN and HLD.    Meldon Sport, MD

## 2024-01-24 NOTE — Assessment & Plan Note (Addendum)
On Crestor 20 mg QD Check lipid profile

## 2024-01-24 NOTE — Patient Instructions (Signed)
 Please continue to take medications as prescribed.  Please continue to follow low salt diet and perform moderate exercise/walking at least 150 mins/week.  Please get fasting blood tests done before the next visit.

## 2024-01-24 NOTE — Assessment & Plan Note (Signed)
 BP Readings from Last 1 Encounters:  01/24/24 125/84   Well-controlled with telmisartan  40 mg QD Advised DASH diet and moderate exercise/walking, at least 150 mins/week

## 2024-01-24 NOTE — Assessment & Plan Note (Signed)
History of cerebral palsy, has learning disability Able to ambulate without support Works as a janitor currently 

## 2024-01-24 NOTE — Assessment & Plan Note (Signed)
 His mother had colon cancer at the age of 39 Referred to GI for colonoscopy

## 2024-01-24 NOTE — Assessment & Plan Note (Signed)
CBC reviewed - showed leukopenia, stable Had Heme/Onc. eval - check CBC

## 2024-01-29 ENCOUNTER — Encounter (INDEPENDENT_AMBULATORY_CARE_PROVIDER_SITE_OTHER): Payer: Self-pay | Admitting: *Deleted

## 2024-05-20 ENCOUNTER — Telehealth: Payer: Self-pay

## 2024-05-20 NOTE — Telephone Encounter (Signed)
Ok to schedule.  Room : any   Thanks,  Vista Lawman, MD Gastroenterology and Hepatology [facility]

## 2024-05-20 NOTE — Telephone Encounter (Signed)
 Noted . Will call to schedule when we get future schedule.

## 2024-05-20 NOTE — Telephone Encounter (Signed)
 Who is your primary care physician: Dr. Suzzane Blanch  Reasons for the colonoscopy:screening  Have you had a colonoscopy before?  no  Do you have family history of colon cancer? no  Previous colonoscopy with polyps removed? no  Do you have a history colorectal cancer?   no  Are you diabetic? If yes, Type 1 or Type 2?    no  Do you have a prosthetic or mechanical heart valve? no  Do you have a pacemaker/defibrillator?   no  Have you had endocarditis/atrial fibrillation? no  Have you had joint replacement within the last 12 months?  no  Do you tend to be constipated or have to use laxatives? no  Do you have any history of drugs or alchohol?  no  Do you use supplemental oxygen?  no  Have you had a stroke or heart attack within the last 6 months? no  Do you take weight loss medication?  no  For male patients: have you had a hysterectomy?  no                                     are you post menopausal?       no                                            do you still have your menstrual cycle? no      Do you take any blood-thinning medications such as: (aspirin, warfarin, Plavix, Aggrenox)  no  If yes we need the name, milligram, dosage and who is prescribing doctor  Current Outpatient Medications on File Prior to Visit  Medication Sig Dispense Refill   rosuvastatin  (CRESTOR ) 20 MG tablet Take 1 tablet (20 mg total) by mouth daily. 90 tablet 3   telmisartan  (MICARDIS ) 40 MG tablet Take 1 tablet (40 mg total) by mouth daily. 90 tablet 3   No current facility-administered medications on file prior to visit.    Allergies  Allergen Reactions   Milk-Related Compounds Shortness Of Breath   Other Shortness Of Breath and Tinitus    Dairy Products     Pharmacy: [facility].  Primary Insurance Name: [place]    [id number]   Best number where you can be reached: [phone]

## 2024-06-10 ENCOUNTER — Encounter: Payer: Self-pay | Admitting: *Deleted

## 2024-06-10 ENCOUNTER — Other Ambulatory Visit: Payer: Self-pay | Admitting: *Deleted

## 2024-06-10 MED ORDER — PEG 3350-KCL-NA BICARB-NACL 420 G PO SOLR: 4000.0000 mL | Freq: Once | ORAL | 0 refills | Status: AC

## 2024-06-10 NOTE — Telephone Encounter (Signed)
 Pt has been scheduled for 07/18/24. Instructions mailed and prep sent to pharmacy.

## 2024-06-12 ENCOUNTER — Encounter (INDEPENDENT_AMBULATORY_CARE_PROVIDER_SITE_OTHER): Payer: Self-pay | Admitting: *Deleted

## 2024-06-12 NOTE — Telephone Encounter (Signed)
 Referral completed, [facility] apt letter sent to PCP

## 2024-06-24 ENCOUNTER — Other Ambulatory Visit: Payer: Self-pay | Admitting: Internal Medicine

## 2024-06-24 DIAGNOSIS — E782 Mixed hyperlipidemia: Secondary | ICD-10-CM

## 2024-07-16 ENCOUNTER — Encounter (HOSPITAL_COMMUNITY)
Admission: RE | Admit: 2024-07-16 | Discharge: 2024-07-16 | Disposition: A | Discharge status: Home / Self Care | Source: Ambulatory Visit | Attending: Gastroenterology | Admitting: Gastroenterology

## 2024-07-16 ENCOUNTER — Encounter (HOSPITAL_COMMUNITY): Payer: Self-pay

## 2024-07-16 NOTE — Progress Notes (Signed)
 Preoperative call completed.

## 2024-07-18 ENCOUNTER — Ambulatory Visit (HOSPITAL_COMMUNITY): Admitting: Certified Registered"

## 2024-07-18 ENCOUNTER — Encounter (HOSPITAL_COMMUNITY): Admission: RE | Disposition: A | Payer: Self-pay | Source: Home / Self Care | Attending: Gastroenterology

## 2024-07-18 ENCOUNTER — Encounter (HOSPITAL_COMMUNITY): Payer: Self-pay | Admitting: Gastroenterology

## 2024-07-18 ENCOUNTER — Ambulatory Visit (HOSPITAL_COMMUNITY)
Admission: RE | Admit: 2024-07-18 | Discharge: 2024-07-18 | Disposition: A | Discharge status: Home / Self Care | Attending: Gastroenterology | Admitting: Gastroenterology

## 2024-07-18 DIAGNOSIS — Z800 Family history of malignant neoplasm of digestive organs: Secondary | ICD-10-CM | POA: Diagnosis not present

## 2024-07-18 DIAGNOSIS — I10 Essential (primary) hypertension: Secondary | ICD-10-CM | POA: Insufficient documentation

## 2024-07-18 DIAGNOSIS — K648 Other hemorrhoids: Secondary | ICD-10-CM | POA: Insufficient documentation

## 2024-07-18 DIAGNOSIS — Z604 Social exclusion and rejection: Secondary | ICD-10-CM | POA: Insufficient documentation

## 2024-07-18 DIAGNOSIS — Z1211 Encounter for screening for malignant neoplasm of colon: Secondary | ICD-10-CM

## 2024-07-18 DIAGNOSIS — K5730 Diverticulosis of large intestine without perforation or abscess without bleeding: Secondary | ICD-10-CM | POA: Insufficient documentation

## 2024-07-18 HISTORY — PX: COLONOSCOPY: SHX5424

## 2024-07-18 LAB — HM COLONOSCOPY

## 2024-07-18 SURGERY — COLONOSCOPY
Anesthesia: General

## 2024-07-18 MED ORDER — LACTATED RINGERS IV SOLN: INTRAVENOUS | Status: DC

## 2024-07-18 MED ORDER — LIDOCAINE 2% (20 MG/ML) 5 ML SYRINGE
INTRAMUSCULAR | Status: DC | PRN
  Administered 2024-07-18: 60 mg via INTRAVENOUS

## 2024-07-18 MED ORDER — PHENYLEPHRINE 80 MCG/ML (10ML) SYRINGE FOR IV PUSH (FOR BLOOD PRESSURE SUPPORT)
PREFILLED_SYRINGE | INTRAVENOUS | Status: DC | PRN
  Administered 2024-07-18: 80 ug via INTRAVENOUS

## 2024-07-18 MED ORDER — PROPOFOL 10 MG/ML IV BOLUS
INTRAVENOUS | Status: DC | PRN
Start: 2024-07-18 — End: 2024-07-18
  Administered 2024-07-18 (×2): 30 mg via INTRAVENOUS
  Administered 2024-07-18: 125 ug/kg/min via INTRAVENOUS
  Administered 2024-07-18: 80 mg via INTRAVENOUS

## 2024-07-18 NOTE — H&P (Addendum)
 Primary Care Physician:  Tobie Suzzane POUR, MD Primary Gastroenterologist:  Dr. Cinderella  Pre-Procedure History & Physical: HPI:  Nicholas Dunlap is a 43 y.o. male is here for a colonoscopy for colon cancer screening purposes.  Patient mother had colon cancer.  No melena or hematochezia.  No abdominal pain or unintentional weight loss.  No change in bowel habits.  Overall feels well from a GI standpoint.  Past Medical History:  Diagnosis Date   Anal fistula    Cerebral palsy (HCC)    CLOSED FRACTURE UNSPEC PHALANX/PHALANGES HAND 08/01/2007   Qualifier: Diagnosis of  By: Margrette MD, Stanley     Eczema    chest, buttock   Finger fracture, left 09/07/2016   fx./dislocation ring finger   Hyperlipidemia    Hypertension    Limp     Past Surgical History:  Procedure Laterality Date   EYE SURGERY Bilateral 1986   FINGER ARTHROPLASTY Left 09/13/2016   Procedure: LEFT RING FINGER VOLAR-PLATE REPAIR;  Surgeon: Alm Hummer, MD;  Location: [facility];  Service: Orthopedics;  Laterality: Left;   FOOT CAPSULE RELEASE W/ PERCUTANEOUS HEEL CORD LENGTHENING, TIBIAL TENDON TRANSFER Left    as a child   INCISION AND DRAINAGE PERIRECTAL ABSCESS N/A 06/13/2022   Procedure: IRRIGATION AND DEBRIDEMENT PERIRECTAL ABSCESS;  Surgeon: Kallie Manuelita JAYSON, MD;  Location: [facility];  Service: General;  Laterality: N/A;    Prior to Admission medications   Medication Sig Start Date End Date Taking? Authorizing Provider  rosuvastatin  (CRESTOR ) 20 MG tablet TAKE 1 TABLET(20 MG) BY MOUTH DAILY 06/24/24  Yes Tobie Suzzane POUR, MD  telmisartan  (MICARDIS ) 40 MG tablet Take 1 tablet (40 mg total) by mouth daily. 01/24/24  Yes Tobie Suzzane POUR, MD    Allergies as of 06/10/2024 - Review Complete 05/20/2024  Allergen Reaction Noted   Milk-related compounds Shortness Of Breath 09/12/2016   Other Shortness Of Breath and Tinitus 06/13/2022    History reviewed. No pertinent family history.  Social History    Socioeconomic History   Marital status: Single    Spouse name: Not on file   Number of children: 0   Years of education: 12   Highest education level: Not on file  Occupational History   Not on file  Tobacco Use   Smoking status: Never   Smokeless tobacco: Never  Vaping Use   Vaping status: Never Used  Substance and Sexual Activity   Alcohol use: No   Drug use: No   Sexual activity: Not Currently  Other Topics Concern   Not on file  Social History Narrative   Not on file   Social Drivers of Health   Financial Resource Strain: Low Risk  (11/20/2023)   Overall Financial Resource Strain (CARDIA)    Difficulty of Paying Living Expenses: Not hard at all  Food Insecurity: No Food Insecurity (11/20/2023)   Hunger Vital Sign    Worried About Running Out of Food in the Last Year: Never true    Ran Out of Food in the Last Year: Never true  Transportation Needs: No Transportation Needs (11/20/2023)   PRAPARE - Administrator, Civil Service (Medical): No    Lack of Transportation (Non-Medical): No  Physical Activity: Insufficiently Active (11/20/2023)   Exercise Vital Sign    Days of Exercise per Week: 3 days    Minutes of Exercise per Session: 40 min  Stress: No Stress Concern Present (11/20/2023)   [employer] of Occupational Health - Occupational  Stress Questionnaire    Feeling of Stress : Only a little  Social Connections: Socially Isolated (11/20/2023)   Social Connection and Isolation Panel    Frequency of Communication with Friends and Family: Once a week    Frequency of Social Gatherings with Friends and Family: Never    Attends Religious Services: More than 4 times per year    Active Member of [employer] or Organizations: No    Attends Banker Meetings: Never    Marital Status: Never married  Intimate Partner Violence: Not At Risk (11/20/2023)   Humiliation, Afraid, Rape, and Kick questionnaire    Fear of Current or Ex-Partner: No    Emotionally  Abused: No    Physically Abused: No    Sexually Abused: No    Review of Systems: See HPI, otherwise negative ROS  Physical Exam: Vital signs in last 24 hours: Temp:  [98 F (36.7 C)] 98 F (36.7 C) (10/23 1100) Resp:  [14] 14 (10/23 1100) BP: (160)/(92) 160/92 (10/23 1100) SpO2:  [100 %] 100 % (10/23 1100)   General:   Alert,  Well-developed, well-nourished, pleasant and cooperative in NAD Head:  Normocephalic and atraumatic. Eyes:  Sclera clear, no icterus.   Conjunctiva pink. Ears:  Normal auditory acuity. Nose:  No deformity, discharge,  or lesions. Msk:  Symmetrical without gross deformities. Normal posture. Extremities:  Without clubbing or edema. Neurologic:  Alert and  oriented x4;  grossly normal neurologically. Skin:  Intact without significant lesions or rashes. Psych:  Alert and cooperative. Normal mood and affect.  Impression/Plan: Nicholas Dunlap is here for a colonoscopy to be performed for colon cancer screening purposes.  The risks of the procedure including infection, bleed, or perforation as well as benefits, limitations, alternatives and imponderables have been reviewed with the patient. Questions have been answered. All parties agreeable.

## 2024-07-18 NOTE — Anesthesia Preprocedure Evaluation (Signed)
 Anesthesia Evaluation  Patient identified by MRN, date of birth, ID band Patient awake    Reviewed: Allergy & Precautions, H&P , NPO status , Patient's Chart, lab work & pertinent test results, reviewed documented beta blocker date and time   Airway Mallampati: II  TM Distance: >3 FB Neck ROM: full    Dental no notable dental hx.    Pulmonary neg pulmonary ROS   Pulmonary exam normal breath sounds clear to auscultation       Cardiovascular Exercise Tolerance: Good hypertension, negative cardio ROS  Rhythm:regular Rate:Normal     Neuro/Psych negative neurological ROS  negative psych ROS   GI/Hepatic negative GI ROS, Neg liver ROS,,,  Endo/Other  negative endocrine ROS    Renal/GU negative Renal ROS  negative genitourinary   Musculoskeletal   Abdominal   Peds  Hematology negative hematology ROS (+)   Anesthesia Other Findings   Reproductive/Obstetrics negative OB ROS                             Anesthesia Physical Anesthesia Plan  ASA: 3  Anesthesia Plan: General   Post-op Pain Management:    Induction:   PONV Risk Score and Plan: Propofol infusion  Airway Management Planned:   Additional Equipment:   Intra-op Plan:   Post-operative Plan:   Informed Consent: I have reviewed the patients History and Physical, chart, labs and discussed the procedure including the risks, benefits and alternatives for the proposed anesthesia with the patient or authorized representative who has indicated his/her understanding and acceptance.     Dental Advisory Given  Plan Discussed with: CRNA  Anesthesia Plan Comments:        Anesthesia Quick Evaluation

## 2024-07-18 NOTE — Discharge Instructions (Signed)

## 2024-07-18 NOTE — Transfer of Care (Signed)
 Immediate Anesthesia Transfer of Care Note  Patient: Nicholas Dunlap  Procedure(s) Performed: COLONOSCOPY  Patient Location: short stay  Anesthesia Type:General  Level of Consciousness: awake, alert , oriented, and patient cooperative  Airway & Oxygen Therapy: Patient Spontanous Breathing  Post-op Assessment: Report given to RN, Post -op Vital signs reviewed and stable, and Patient moving all extremities X 4  Post vital signs: Reviewed and stable  Last Vitals:  Vitals Value Taken Time  BP 108/51 07/18/24 13:56  Temp 36.6 C 07/18/24 13:56  Pulse 72 07/18/24 13:56  Resp 18 07/18/24 13:56  SpO2 100 % 07/18/24 13:56    Last Pain:  Vitals:   07/18/24 1356  TempSrc: Oral  PainSc: Asleep         Complications: No notable events documented.

## 2024-07-18 NOTE — Anesthesia Procedure Notes (Signed)
 Date/Time: 07/18/2024 1:26 PM  Performed by: Willow Shidler, Belinda L, CRNAOxygen Delivery Method: Nasal cannula

## 2024-07-18 NOTE — Op Note (Signed)
 [facility] Patient Name: Nicholas Dunlap Procedure Date: 07/18/2024 12:38 PM MRN: [id number] Date of Birth: [birth year] Attending MD: Deatrice Dine , MD, [id number] CSN: [id number] Age: 43 Admit Type: Outpatient Procedure:                Colonoscopy Indications:              Screening in patient at increased risk: Family                            history of 1st-degree relative with colorectal                            cancer before age 30 years Providers:                Deatrice Dine, MD, Harlene Lips, Rosina Sprague Referring MD:              Medicines:                Monitored Anesthesia Care Complications:            No immediate complications. Estimated Blood Loss:     Estimated blood loss: none. Procedure:                Pre-Anesthesia Assessment:                           - Prior to the procedure, a History and Physical                            was performed, and patient medications and                            allergies were reviewed. The patient's tolerance of                            previous anesthesia was also reviewed. The risks                            and benefits of the procedure and the sedation                            options and risks were discussed with the patient.                            All questions were answered, and informed consent                            was obtained. Prior Anticoagulants: The patient has                            taken no anticoagulant or antiplatelet agents. ASA                            Grade Assessment: II - A patient with mild systemic  disease. After reviewing the risks and benefits,                            the patient was deemed in satisfactory condition to                            undergo the procedure.                           After obtaining informed consent, the colonoscope                            was passed under direct vision. Throughout the                             procedure, the patient's blood pressure, pulse, and                            oxygen saturations were monitored continuously. The                            [id number]-[id number] ([id number]) Colon was introduced through                            the anus and advanced to the the cecum, identified                            by appendiceal orifice and ileocecal valve. The                            colonoscopy was performed without difficulty. The                            patient tolerated the procedure well. The quality                            of the bowel preparation was evaluated using the                            BBPS [facility] Bowel Preparation Scale) with scores                            of: Right Colon = 3, Transverse Colon = 3 and Left                            Colon = 3 (entire mucosa seen well with no residual                            staining, small fragments of stool or opaque                            liquid). The total BBPS score equals 9. The  ileocecal valve, appendiceal orifice, and rectum                            were photographed. Scope In: 1:33:34 PM Scope Out: 1:51:12 PM Scope Withdrawal Time: 0 hours 15 minutes 41 seconds  Total Procedure Duration: 0 hours 17 minutes 38 seconds  Findings:      The perianal and digital rectal examinations were normal.      A few small-mouthed diverticula were found in the left colon.      Non-bleeding internal hemorrhoids were found during retroflexion. The       hemorrhoids were small. Impression:               - Diverticulosis in the left colon.                           - Non-bleeding internal hemorrhoids.                           - No specimens collected. Moderate Sedation:      Per Anesthesia Care Recommendation:           - Patient has a contact number available for                            emergencies. The signs and symptoms of potential                            delayed complications were  discussed with the                            patient. Return to normal activities tomorrow.                            Written discharge instructions were provided to the                            patient.                           - Resume previous diet.                           - Continue present medications.                           - Repeat colonoscopy in 5 years for screening                            purposes.                           - Return to primary care physician as previously                            scheduled. Procedure Code(s):        --- Professional ---  G0105, Colorectal cancer screening; colonoscopy on                            individual at high risk Diagnosis Code(s):        --- Professional ---                           Z80.0, Family history of malignant neoplasm of                            digestive organs                           K64.8, Other hemorrhoids                           K57.30, Diverticulosis of large intestine without                            perforation or abscess without bleeding CPT copyright 2022 American Medical Association. All rights reserved. The codes documented in this report are preliminary and upon coder review may  be revised to meet current compliance requirements. Deatrice Dine, MD Deatrice Dine, MD 07/18/2024 2:04:57 PM This report has been signed electronically. Number of Addenda: 0

## 2024-07-19 ENCOUNTER — Encounter (HOSPITAL_COMMUNITY): Payer: Self-pay | Admitting: Gastroenterology

## 2024-07-19 NOTE — Anesthesia Postprocedure Evaluation (Signed)
 Anesthesia Post Note  Patient: Nicholas Dunlap  Procedure(s) Performed: COLONOSCOPY  Patient location during evaluation: Phase II Anesthesia Type: General Level of consciousness: awake Pain management: pain level controlled Vital Signs Assessment: post-procedure vital signs reviewed and stable Respiratory status: spontaneous breathing and respiratory function stable Cardiovascular status: blood pressure returned to baseline and stable Postop Assessment: no headache and no apparent nausea or vomiting Anesthetic complications: no Comments: Late entry   No notable events documented.   Last Vitals:  Vitals:   07/18/24 1356 07/18/24 1402  BP: (!) 108/51 132/71  Pulse: 72   Resp: 18   Temp: 36.6 C   SpO2: 100%     Last Pain:  Vitals:   07/18/24 1356  TempSrc: Oral  PainSc: Asleep                 Yvonna JINNY Bosworth

## 2024-07-23 ENCOUNTER — Encounter (INDEPENDENT_AMBULATORY_CARE_PROVIDER_SITE_OTHER): Payer: Self-pay | Admitting: *Deleted

## 2024-07-25 ENCOUNTER — Encounter: Payer: Self-pay | Admitting: Internal Medicine

## 2024-07-25 ENCOUNTER — Ambulatory Visit: Admitting: Internal Medicine

## 2024-07-25 VITALS — BP 116/79 | HR 72 | Ht 70.0 in | Wt 268.6 lb

## 2024-07-25 DIAGNOSIS — I10 Essential (primary) hypertension: Secondary | ICD-10-CM

## 2024-07-25 DIAGNOSIS — E782 Mixed hyperlipidemia: Secondary | ICD-10-CM

## 2024-07-25 DIAGNOSIS — G809 Cerebral palsy, unspecified: Secondary | ICD-10-CM

## 2024-07-25 DIAGNOSIS — D72819 Decreased white blood cell count, unspecified: Secondary | ICD-10-CM | POA: Diagnosis not present

## 2024-07-25 DIAGNOSIS — Z800 Family history of malignant neoplasm of digestive organs: Secondary | ICD-10-CM | POA: Diagnosis not present

## 2024-07-25 NOTE — Assessment & Plan Note (Signed)
On Crestor 20 mg QD Check lipid profile

## 2024-07-25 NOTE — Assessment & Plan Note (Signed)
History of cerebral palsy, has learning disability Able to ambulate without support Works as a janitor currently 

## 2024-07-25 NOTE — Assessment & Plan Note (Addendum)
 Last CBC reviewed - showed leukopenia, stable Had Heme/Onc. eval - check CBC

## 2024-07-25 NOTE — Assessment & Plan Note (Addendum)
 His mother had colon cancer at the age of 74 Followed by GI for colonoscopy - last colonoscopy in 10/25, showed diverticulosis, plan to repeat colonoscopy every 5 years

## 2024-07-25 NOTE — Assessment & Plan Note (Addendum)
 BP Readings from Last 1 Encounters:  07/25/24 116/79   Well-controlled with telmisartan  40 mg QD Advised DASH diet and moderate exercise/walking, at least 150 mins/week

## 2024-07-25 NOTE — Patient Instructions (Addendum)
Please continue to take medications as prescribed.  Please continue to follow low salt diet and perform moderate exercise/walking at least 150 mins/week. 

## 2024-07-25 NOTE — Progress Notes (Signed)
 Established Patient Office Visit  Subjective:  Patient ID: Nicholas Dunlap, male    DOB: 20-Dec-1980  Age: 43 y.o. MRN: [id number]  CC:  Chief Complaint  Patient presents with   Hypertension    6 month f/u    Hyperlipidemia    6 month f/u    Results    Had colonoscopy would like to discuss results.     HPI Nicholas Dunlap is a 43 y.o. male with past medical history of cerebral palsy, HTN and HLD who presents for f/u of his chronic medical conditions.  HTN: BP is wnl today. Takes telmisartan  40 mg QD regularly. Patient denies headache, dizziness, chest pain, dyspnea or palpitations.  HLD: He takes Crestor  20 mg QD for it.  He was evaluated by Heme/Onc. for leukopenia, but his WBC counts were deemed to be stable. He denies any fever, chills, night sweats or unintentional weight loss.  Denies any signs of active bleeding or easy bruising.    Past Medical History:  Diagnosis Date   Anal fistula    Cerebral palsy (HCC)    CLOSED FRACTURE UNSPEC PHALANX/PHALANGES HAND 08/01/2007   Qualifier: Diagnosis of  By: Margrette MD, Stanley     Eczema    chest, buttock   Finger fracture, left 09/07/2016   fx./dislocation ring finger   Hyperlipidemia    Hypertension    Limp     Past Surgical History:  Procedure Laterality Date   COLONOSCOPY N/A 07/18/2024   Procedure: COLONOSCOPY;  Surgeon: Cinderella Deatrice FALCON, MD;  Location: [facility];  Service: Endoscopy;  Laterality: N/A;  1:00 pm, asa 1-2   EYE SURGERY Bilateral 1986   FINGER ARTHROPLASTY Left 09/13/2016   Procedure: LEFT RING FINGER VOLAR-PLATE REPAIR;  Surgeon: Alm Hummer, MD;  Location: [facility];  Service: Orthopedics;  Laterality: Left;   FOOT CAPSULE RELEASE W/ PERCUTANEOUS HEEL CORD LENGTHENING, TIBIAL TENDON TRANSFER Left    as a child   INCISION AND DRAINAGE PERIRECTAL ABSCESS N/A 06/13/2022   Procedure: IRRIGATION AND DEBRIDEMENT PERIRECTAL ABSCESS;  Surgeon: Kallie Manuelita JAYSON, MD;   Location: [facility];  Service: General;  Laterality: N/A;    History reviewed. No pertinent family history.  Social History   Socioeconomic History   Marital status: Single    Spouse name: Not on file   Number of children: 0   Years of education: 12   Highest education level: Not on file  Occupational History   Not on file  Tobacco Use   Smoking status: Never   Smokeless tobacco: Never  Vaping Use   Vaping status: Never Used  Substance and Sexual Activity   Alcohol use: No   Drug use: No   Sexual activity: Not Currently  Other Topics Concern   Not on file  Social History Narrative   Not on file   Social Drivers of Health   Financial Resource Strain: Low Risk  (11/20/2023)   Overall Financial Resource Strain (CARDIA)    Difficulty of Paying Living Expenses: Not hard at all  Food Insecurity: No Food Insecurity (11/20/2023)   Hunger Vital Sign    Worried About Running Out of Food in the Last Year: Never true    Ran Out of Food in the Last Year: Never true  Transportation Needs: No Transportation Needs (11/20/2023)   PRAPARE - Administrator, Civil Service (Medical): No    Lack of Transportation (Non-Medical): No  Physical Activity: Insufficiently Active (11/20/2023)   Exercise  Vital Sign    Days of Exercise per Week: 3 days    Minutes of Exercise per Session: 40 min  Stress: No Stress Concern Present (11/20/2023)   [employer] of Occupational Health - Occupational Stress Questionnaire    Feeling of Stress : Only a little  Social Connections: Socially Isolated (11/20/2023)   Social Connection and Isolation Panel    Frequency of Communication with Friends and Family: Once a week    Frequency of Social Gatherings with Friends and Family: Never    Attends Religious Services: More than 4 times per year    Active Member of [employer] or Organizations: No    Attends Banker Meetings: Never    Marital Status: Never married  Intimate Partner Violence: Not  At Risk (11/20/2023)   Humiliation, Afraid, Rape, and Kick questionnaire    Fear of Current or Ex-Partner: No    Emotionally Abused: No    Physically Abused: No    Sexually Abused: No    Outpatient Medications Prior to Visit  Medication Sig Dispense Refill   rosuvastatin  (CRESTOR ) 20 MG tablet TAKE 1 TABLET(20 MG) BY MOUTH DAILY 90 tablet 3   telmisartan  (MICARDIS ) 40 MG tablet Take 1 tablet (40 mg total) by mouth daily. 90 tablet 3   No facility-administered medications prior to visit.    Allergies  Allergen Reactions   Milk-Related Compounds Shortness Of Breath   Other Shortness Of Breath and Tinitus    Dairy Products    ROS Review of Systems  Constitutional:  Negative for chills and fever.  HENT:  Negative for congestion and sore throat.   Eyes:  Negative for pain and discharge.  Respiratory:  Negative for cough and shortness of breath.   Cardiovascular:  Negative for chest pain and palpitations.  Gastrointestinal:  Negative for constipation, diarrhea, nausea and vomiting.  Endocrine: Negative for polydipsia and polyuria.  Genitourinary:  Negative for dysuria and hematuria.  Musculoskeletal:  Negative for neck pain and neck stiffness.  Skin:  Negative for rash.  Neurological:  Negative for dizziness, weakness, numbness and headaches.  Psychiatric/Behavioral:  Negative for agitation and behavioral problems.       Objective:    Physical Exam Vitals reviewed.  Constitutional:      General: He is not in acute distress.    Appearance: He is obese. He is not diaphoretic.  HENT:     Head: Normocephalic and atraumatic.     Nose: Nose normal.     Mouth/Throat:     Mouth: Mucous membranes are moist.  Eyes:     General: No scleral icterus.    Extraocular Movements: Extraocular movements intact.     Comments: B/l strabismus  Cardiovascular:     Rate and Rhythm: Normal rate and regular rhythm.     Heart sounds: Normal heart sounds. No murmur heard. Pulmonary:      Breath sounds: Normal breath sounds. No wheezing or rales.  Musculoskeletal:     Cervical back: Neck supple. No tenderness.     Right lower leg: No edema.     Left lower leg: No edema.  Skin:    General: Skin is warm.     Findings: No rash.  Neurological:     General: No focal deficit present.     Mental Status: He is alert and oriented to person, place, and time.     Sensory: No sensory deficit.     Motor: No weakness.  Psychiatric:  Mood and Affect: Mood normal.        Behavior: Behavior normal.     BP 116/79 (BP Location: Left Arm, Patient Position: Sitting)   Pulse 72   Ht 5' 10 (1.778 m)   Wt 268 lb 9.6 oz (121.8 kg)   SpO2 97%   BMI 38.54 kg/m  Wt Readings from Last 3 Encounters:  07/25/24 268 lb 9.6 oz (121.8 kg)  01/24/24 277 lb 12.8 oz (126 kg)  11/20/23 280 lb (127 kg)    Lab Results  Component Value Date   TSH 1.360 02/27/2023   Lab Results  Component Value Date   WBC 3.1 (L) 06/21/2023   HGB 14.5 06/21/2023   HCT 45.0 06/21/2023   MCV 80.9 06/21/2023   PLT 179 06/21/2023   Lab Results  Component Value Date   NA 143 02/27/2023   K 4.6 02/27/2023   CO2 24 02/27/2023   GLUCOSE 89 02/27/2023   BUN 16 02/27/2023   CREATININE 1.08 02/27/2023   BILITOT 0.6 02/27/2023   ALKPHOS 71 02/27/2023   AST 21 02/27/2023   ALT 24 02/27/2023   PROT 6.8 02/27/2023   ALBUMIN 4.4 02/27/2023   CALCIUM  9.1 02/27/2023   ANIONGAP 7 06/13/2022   EGFR 87 02/27/2023   Lab Results  Component Value Date   CHOL 144 02/27/2023   Lab Results  Component Value Date   HDL 70 02/27/2023   Lab Results  Component Value Date   LDLCALC 66 02/27/2023   Lab Results  Component Value Date   TRIG 26 02/27/2023   Lab Results  Component Value Date   CHOLHDL 2.1 02/27/2023   Lab Results  Component Value Date   HGBA1C 5.4 02/27/2023      Assessment & Plan:   Problem List Items Addressed This Visit       Cardiovascular and Mediastinum   Essential  hypertension - Primary   BP Readings from Last 1 Encounters:  07/25/24 116/79   Well-controlled with telmisartan  40 mg QD Advised DASH diet and moderate exercise/walking, at least 150 mins/week        Nervous and Auditory   Cerebral palsy (HCC)   History of cerebral palsy, has learning disability Able to ambulate without support Works as a copy currently        Other   Mixed hyperlipidemia   On Crestor  20 mg QD Check lipid profile      Leukopenia   Last CBC reviewed - showed leukopenia, stable Had Heme/Onc. eval - check CBC      Family history of colon cancer in mother   His mother had colon cancer at the age of 41 Followed by GI for colonoscopy - last colonoscopy in 10/25, showed diverticulosis, plan to repeat colonoscopy every 5 years       No orders of the defined types were placed in this encounter.   Follow-up: Return in about 6 months (around 01/23/2025) for HTN and HLD.    Suzzane MARLA Blanch, MD

## 2024-07-26 LAB — CMP14+EGFR
ALT: 24 IU/L (ref 0–44)
AST: 25 IU/L (ref 0–40)
Albumin: 4.4 g/dL (ref 4.1–5.1)
Alkaline Phosphatase: 70 IU/L (ref 47–123)
BUN/Creatinine Ratio: 12 (ref 9–20)
BUN: 13 mg/dL (ref 6–24)
Bilirubin Total: 1.4 mg/dL — ABNORMAL HIGH (ref 0.0–1.2)
CO2: 22 mmol/L (ref 20–29)
Calcium: 9.4 mg/dL (ref 8.7–10.2)
Chloride: 100 mmol/L (ref 96–106)
Creatinine, Ser: 1.07 mg/dL (ref 0.76–1.27)
Globulin, Total: 2.3 g/dL (ref 1.5–4.5)
Glucose: 78 mg/dL (ref 70–99)
Potassium: 4.2 mmol/L (ref 3.5–5.2)
Sodium: 139 mmol/L (ref 134–144)
Total Protein: 6.7 g/dL (ref 6.0–8.5)
eGFR: 87 mL/min/1.73 (ref >59)

## 2024-07-26 LAB — LIPID PANEL
Chol/HDL Ratio: 2.2 ratio (ref 0.0–5.0)
Cholesterol, Total: 146 mg/dL (ref 100–199)
HDL: 67 mg/dL (ref >39)
LDL Chol Calc (NIH): 71 mg/dL (ref 0–99)
Triglycerides: 27 mg/dL (ref 0–149)
VLDL Cholesterol Cal: 8 mg/dL (ref 5–40)

## 2024-07-26 LAB — CBC WITH DIFFERENTIAL/PLATELET
Basophils Absolute: 0 x10E3/uL (ref 0.0–0.2)
Basos: 1 %
EOS (ABSOLUTE): 0 x10E3/uL (ref 0.0–0.4)
Eos: 1 %
Hematocrit: 46.3 % (ref 37.5–51.0)
Hemoglobin: 14.7 g/dL (ref 13.0–17.7)
Immature Grans (Abs): 0 x10E3/uL (ref 0.0–0.1)
Immature Granulocytes: 0 %
Lymphocytes Absolute: 0.8 x10E3/uL (ref 0.7–3.1)
Lymphs: 37 %
MCH: 26.1 pg — ABNORMAL LOW (ref 26.6–33.0)
MCHC: 31.7 g/dL (ref 31.5–35.7)
MCV: 82 fL (ref 79–97)
Monocytes Absolute: 0.2 x10E3/uL (ref 0.1–0.9)
Monocytes: 9 %
Neutrophils Absolute: 1.2 x10E3/uL — ABNORMAL LOW (ref 1.4–7.0)
Neutrophils: 52 %
Platelets: 151 x10E3/uL (ref 150–450)
RBC: 5.64 x10E6/uL (ref 4.14–5.80)
RDW: 13.1 % (ref 11.6–15.4)
WBC: 2.3 x10E3/uL — CL (ref 3.4–10.8)

## 2024-07-26 LAB — TSH: TSH: 1.03 u[IU]/mL (ref 0.450–4.500)

## 2024-07-26 LAB — HEMOGLOBIN A1C
Est. average glucose Bld gHb Est-mCnc: 111 mg/dL
Hgb A1c MFr Bld: 5.5 % (ref 4.8–5.6)

## 2024-07-26 LAB — VITAMIN D 25 HYDROXY (VIT D DEFICIENCY, FRACTURES): Vit D, 25-Hydroxy: 22.8 ng/mL — ABNORMAL LOW (ref 30.0–100.0)

## 2024-07-28 ENCOUNTER — Ambulatory Visit: Payer: Self-pay | Admitting: Internal Medicine

## 2024-11-20 ENCOUNTER — Ambulatory Visit: Payer: Medicare Other

## 2024-11-22 ENCOUNTER — Ambulatory Visit

## 2024-12-17 ENCOUNTER — Ambulatory Visit

## 2025-01-23 ENCOUNTER — Ambulatory Visit: Admitting: Internal Medicine
